# Patient Record
Sex: Female | Born: 1990 | Race: White | Hispanic: No | Marital: Single | Smoking: Former smoker
Health system: Southern US, Community
[De-identification: ages and names within clinical notes are randomized; demographics above are authoritative.]

## PROBLEM LIST (undated history)

## (undated) ENCOUNTER — Inpatient Hospital Stay (HOSPITAL_COMMUNITY): Payer: Self-pay

## (undated) DIAGNOSIS — F419 Anxiety disorder, unspecified: Secondary | ICD-10-CM

## (undated) DIAGNOSIS — Z789 Other specified health status: Secondary | ICD-10-CM

## (undated) DIAGNOSIS — G43909 Migraine, unspecified, not intractable, without status migrainosus: Secondary | ICD-10-CM

## (undated) DIAGNOSIS — F32A Depression, unspecified: Secondary | ICD-10-CM

## (undated) DIAGNOSIS — F329 Major depressive disorder, single episode, unspecified: Secondary | ICD-10-CM

## (undated) HISTORY — PX: WISDOM TOOTH EXTRACTION: SHX21

---

## 2012-09-25 ENCOUNTER — Encounter (HOSPITAL_COMMUNITY): Payer: Self-pay | Admitting: *Deleted

## 2012-09-25 DIAGNOSIS — R112 Nausea with vomiting, unspecified: Secondary | ICD-10-CM | POA: Insufficient documentation

## 2012-09-25 DIAGNOSIS — R109 Unspecified abdominal pain: Secondary | ICD-10-CM | POA: Insufficient documentation

## 2012-09-25 DIAGNOSIS — Z79899 Other long term (current) drug therapy: Secondary | ICD-10-CM | POA: Insufficient documentation

## 2012-09-25 DIAGNOSIS — Z3202 Encounter for pregnancy test, result negative: Secondary | ICD-10-CM | POA: Insufficient documentation

## 2012-09-25 LAB — COMPREHENSIVE METABOLIC PANEL
AST: 20 U/L (ref 0–37)
Albumin: 4 g/dL (ref 3.5–5.2)
Alkaline Phosphatase: 56 U/L (ref 39–117)
BUN: 9 mg/dL (ref 6–23)
Potassium: 3.9 mEq/L (ref 3.5–5.1)
Sodium: 137 mEq/L (ref 135–145)
Total Protein: 7.7 g/dL (ref 6.0–8.3)

## 2012-09-25 LAB — URINE MICROSCOPIC-ADD ON

## 2012-09-25 LAB — URINALYSIS, ROUTINE W REFLEX MICROSCOPIC
Ketones, ur: NEGATIVE mg/dL
Nitrite: NEGATIVE
Protein, ur: NEGATIVE mg/dL
Urobilinogen, UA: 1 mg/dL (ref 0.0–1.0)

## 2012-09-25 LAB — CBC WITH DIFFERENTIAL/PLATELET
Basophils Absolute: 0.1 10*3/uL (ref 0.0–0.1)
Basophils Relative: 0 % (ref 0–1)
Eosinophils Absolute: 0.6 10*3/uL (ref 0.0–0.7)
MCH: 29.8 pg (ref 26.0–34.0)
MCHC: 35 g/dL (ref 30.0–36.0)
Monocytes Relative: 7 % (ref 3–12)
Neutrophils Relative %: 72 % (ref 43–77)
Platelets: 366 10*3/uL (ref 150–400)
RDW: 12.5 % (ref 11.5–15.5)

## 2012-09-25 NOTE — ED Notes (Signed)
Rt sided flank pain for 2-3 days no bm for 6 days nauseated.  lmp  Last month

## 2012-09-26 ENCOUNTER — Emergency Department (HOSPITAL_COMMUNITY)
Admission: EM | Admit: 2012-09-26 | Discharge: 2012-09-26 | Disposition: A | Discharge status: Home / Self Care | Payer: Self-pay | Attending: Emergency Medicine | Admitting: Emergency Medicine

## 2012-09-26 ENCOUNTER — Encounter (HOSPITAL_COMMUNITY): Payer: Self-pay | Admitting: Radiology

## 2012-09-26 ENCOUNTER — Emergency Department (HOSPITAL_COMMUNITY): Payer: Self-pay

## 2012-09-26 DIAGNOSIS — R109 Unspecified abdominal pain: Secondary | ICD-10-CM

## 2012-09-26 MED ORDER — ONDANSETRON HCL 4 MG/2ML IJ SOLN
4.0000 mg | Freq: Once | INTRAMUSCULAR | Status: AC
  Administered 2012-09-26: 4 mg via INTRAVENOUS
  Filled 2012-09-26: qty 2

## 2012-09-26 MED ORDER — KETOROLAC TROMETHAMINE 30 MG/ML IJ SOLN
30.0000 mg | Freq: Once | INTRAMUSCULAR | Status: AC
  Administered 2012-09-26: 30 mg via INTRAVENOUS
  Filled 2012-09-26: qty 1

## 2012-09-26 MED ORDER — HYDROCODONE-ACETAMINOPHEN 5-325 MG PO TABS: 1.0000 | ORAL_TABLET | ORAL | Status: DC | PRN

## 2012-09-26 MED ORDER — ONDANSETRON 8 MG PO TBDP: 8.0000 mg | ORAL_TABLET | Freq: Three times a day (TID) | ORAL | Status: DC | PRN

## 2012-09-26 MED ORDER — MORPHINE SULFATE 4 MG/ML IJ SOLN
6.0000 mg | Freq: Once | INTRAMUSCULAR | Status: AC
  Administered 2012-09-26: 4 mg via INTRAVENOUS
  Filled 2012-09-26 (×2): qty 1

## 2012-09-26 NOTE — ED Notes (Signed)
Patient transported to CT 

## 2012-09-26 NOTE — ED Provider Notes (Signed)
History     CSN: [id number]  Arrival date & time 09/25/12  2101   First MD Initiated Contact with Patient 09/26/12 0023      Chief Complaint  Patient presents with  . Flank Pain     The history is provided by the patient.   patient reports right-sided abdominal pain and right-sided flank pain for 2-3 days.  She reports colicky pain.  No fevers or chills.  She's had nausea without vomiting.  She denies diarrhea.  No urinary complaints.  No vaginal complaints.  No melena or hematochezia.  Her symptoms are mild to moderate in severity.  Nothing worsens or improves her symptoms.  She denies fevers and chills  History reviewed. No pertinent past medical history.  History reviewed. No pertinent past surgical history.  No family history on file.  History  Substance Use Topics  . Smoking status: Never Smoker   . Smokeless tobacco: Not on file  . Alcohol Use: Yes    OB History   Grav Para Term Preterm Abortions TAB SAB Ect Mult Living                  Review of Systems  Genitourinary: Positive for flank pain.  All other systems reviewed and are negative.    Allergies  Pineapple  Home Medications   Current Outpatient Rx  Name  Route  Sig  Dispense  Refill  . atomoxetine (STRATTERA) 25 MG capsule   Oral   Take 25 mg by mouth daily.           BP 123/78  Pulse 73  Temp(Src) 98.4 F (36.9 C) (Oral)  Resp 18  SpO2 98%  LMP 09/11/2012  Physical Exam  Nursing note and vitals reviewed. Constitutional: She is oriented to person, place, and time. She appears well-developed and well-nourished. No distress.  HENT:  Head: Normocephalic and atraumatic.  Eyes: EOM are normal.  Neck: Normal range of motion.  Cardiovascular: Normal rate, regular rhythm and normal heart sounds.   Pulmonary/Chest: Effort normal and breath sounds normal.  Abdominal: Soft. She exhibits no distension.  Mild right-sided abdominal tenderness and right flank tenderness.  No guarding or rebound   Musculoskeletal: Normal range of motion.  Neurological: She is alert and oriented to person, place, and time.  Skin: Skin is warm and dry.  Psychiatric: She has a normal mood and affect. Judgment normal.    ED Course  Procedures (including critical care time)  Labs Reviewed  URINALYSIS, ROUTINE W REFLEX MICROSCOPIC - Abnormal; Notable for the following:    APPearance CLOUDY (*)    Hgb urine dipstick MODERATE (*)    Leukocytes, UA MODERATE (*)    All other components within normal limits  CBC WITH DIFFERENTIAL - Abnormal; Notable for the following:    WBC 15.7 (*)    Neutro Abs 11.3 (*)    All other components within normal limits  COMPREHENSIVE METABOLIC PANEL - Abnormal; Notable for the following:    Total Bilirubin 0.2 (*)    All other components within normal limits  URINE MICROSCOPIC-ADD ON - Abnormal; Notable for the following:    Squamous Epithelial / LPF FEW (*)    Bacteria, UA FEW (*)    All other components within normal limits  URINE CULTURE  PREGNANCY, URINE   Ct Abdomen Pelvis Wo Contrast  09/26/2012  *RADIOLOGY REPORT*  Clinical Data: 22 year old female with right flank, abdominal and pelvic pain.  CT ABDOMEN AND PELVIS WITHOUT CONTRAST  Technique:  Multidetector CT imaging of the abdomen and pelvis was performed following the standard protocol without intravenous contrast.  Comparison: None  Findings: The liver, gallbladder, spleen, pancreas, adrenal glands and kidneys are unremarkable.  There is no evidence of hydronephrosis or urinary calculi. Please note that parenchymal abnormalities may be missed as intravenous contrast was not administered. No enlarged lymph nodes, biliary dilation or abdominal aortic aneurysm identified.  A small amount of free pelvic fluid is likely physiologic. The bowel, appendix and bladder are unremarkable. The uterus and adnexal regions are within normal limits.  No acute or suspicious bony abnormalities are identified.  IMPRESSION: Trace  amount of free pelvic fluid - likely physiologic.  No other significant abnormalities identified.   Original Report Authenticated By: Harmon Pier, M.D.    I personally reviewed the imaging tests through PACS system I reviewed available ER/hospitalization records through the EMR   1. Abdominal pain       MDM  3:02 AM The patient feels much better at this time.  Abdominal exam is benign.  CT scan without acute pathology.  Urine and labs significant abnormality.  Discharge home and nausea medicine and pain medicine.  She understands return to the ER for new or worsening symptoms        Lyanne Co, MD 09/26/12 [phone]

## 2012-09-27 LAB — URINE CULTURE

## 2012-09-28 ENCOUNTER — Telehealth (HOSPITAL_COMMUNITY): Payer: Self-pay | Admitting: Emergency Medicine

## 2012-09-28 NOTE — ED Notes (Signed)
Patient has +Urine culture. Checking to see if appropriately treated. °

## 2012-09-28 NOTE — ED Notes (Signed)
+   Urine Chart sent to EDP office for review. 

## 2012-09-30 ENCOUNTER — Telehealth (HOSPITAL_COMMUNITY): Payer: Self-pay | Admitting: Emergency Medicine

## 2015-02-27 ENCOUNTER — Inpatient Hospital Stay (HOSPITAL_COMMUNITY)
Admission: AD | Admit: 2015-02-27 | Discharge: 2015-02-27 | Disposition: A | Discharge status: Home / Self Care | Payer: PRIVATE HEALTH INSURANCE | Source: Ambulatory Visit | Attending: Obstetrics and Gynecology | Admitting: Obstetrics and Gynecology

## 2015-02-27 ENCOUNTER — Encounter (HOSPITAL_COMMUNITY): Payer: Self-pay | Admitting: *Deleted

## 2015-02-27 ENCOUNTER — Inpatient Hospital Stay (HOSPITAL_COMMUNITY): Payer: PRIVATE HEALTH INSURANCE

## 2015-02-27 DIAGNOSIS — N939 Abnormal uterine and vaginal bleeding, unspecified: Secondary | ICD-10-CM | POA: Diagnosis present

## 2015-02-27 DIAGNOSIS — O209 Hemorrhage in early pregnancy, unspecified: Secondary | ICD-10-CM | POA: Diagnosis not present

## 2015-02-27 DIAGNOSIS — O4691 Antepartum hemorrhage, unspecified, first trimester: Secondary | ICD-10-CM

## 2015-02-27 DIAGNOSIS — Z3A01 Less than 8 weeks gestation of pregnancy: Secondary | ICD-10-CM | POA: Diagnosis not present

## 2015-02-27 DIAGNOSIS — Z87891 Personal history of nicotine dependence: Secondary | ICD-10-CM | POA: Insufficient documentation

## 2015-02-27 DIAGNOSIS — O3680X0 Pregnancy with inconclusive fetal viability, not applicable or unspecified: Secondary | ICD-10-CM

## 2015-02-27 LAB — HCG, QUANTITATIVE, PREGNANCY: hCG, Beta Chain, Quant, S: 293 m[IU]/mL — ABNORMAL HIGH (ref <5)

## 2015-02-27 LAB — URINE MICROSCOPIC-ADD ON

## 2015-02-27 LAB — GC/CHLAMYDIA PROBE AMP (~~LOC~~) NOT AT ARMC
Chlamydia: NEGATIVE
NEISSERIA GONORRHEA: NEGATIVE

## 2015-02-27 LAB — URINALYSIS, ROUTINE W REFLEX MICROSCOPIC
Bilirubin Urine: NEGATIVE
Glucose, UA: NEGATIVE mg/dL
Ketones, ur: NEGATIVE mg/dL
Leukocytes, UA: NEGATIVE
NITRITE: NEGATIVE
PH: 6 (ref 5.0–8.0)
Protein, ur: NEGATIVE mg/dL
SPECIFIC GRAVITY, URINE: 1.025 (ref 1.005–1.030)
Urobilinogen, UA: 0.2 mg/dL (ref 0.0–1.0)

## 2015-02-27 LAB — CBC
HEMATOCRIT: 41.2 % (ref 36.0–46.0)
Hemoglobin: 14 g/dL (ref 12.0–15.0)
MCH: 30.3 pg (ref 26.0–34.0)
MCHC: 34 g/dL (ref 30.0–36.0)
MCV: 89.2 fL (ref 78.0–100.0)
Platelets: 328 10*3/uL (ref 150–400)
RBC: 4.62 MIL/uL (ref 3.87–5.11)
RDW: 13.1 % (ref 11.5–15.5)
WBC: 11.2 10*3/uL — AB (ref 4.0–10.5)

## 2015-02-27 LAB — HIV ANTIBODY (ROUTINE TESTING W REFLEX): HIV Screen 4th Generation wRfx: NONREACTIVE

## 2015-02-27 LAB — WET PREP, GENITAL
CLUE CELLS WET PREP: NONE SEEN
Trich, Wet Prep: NONE SEEN
YEAST WET PREP: NONE SEEN

## 2015-02-27 LAB — ABO/RH: ABO/RH(D): O POS

## 2015-02-27 LAB — POCT PREGNANCY, URINE: Preg Test, Ur: POSITIVE — AB

## 2015-02-27 NOTE — Discharge Instructions (Signed)
°Ectopic Pregnancy °An ectopic pregnancy is when the fertilized egg attaches (implants) outside the uterus. Most ectopic pregnancies occur in the fallopian tube. Rarely do ectopic pregnancies occur on the ovary, intestine, pelvis, or cervix. In an ectopic pregnancy, the fertilized egg does not have the ability to develop into a normal, healthy baby.  °A ruptured ectopic pregnancy is one in which the fallopian tube gets torn or bursts and results in internal bleeding. Often there is intense abdominal pain, and sometimes, vaginal bleeding. Having an ectopic pregnancy can be life threatening. If left untreated, this dangerous condition can lead to a blood transfusion, abdominal surgery, or even death. °CAUSES  °Damage to the fallopian tubes is the suspected cause in most ectopic pregnancies.  °RISK FACTORS °Depending on your circumstances, the risk of having an ectopic pregnancy will vary. The level of risk can be divided into three categories. °High Risk °· You have gone through infertility treatment. °· You have had a previous ectopic pregnancy. °· You have had previous tubal surgery. °· You have had previous surgery to have the fallopian tubes tied (tubal ligation). °· You have tubal problems or diseases. °· You have been exposed to DES. DES is a medicine that was used until 1971 and had effects on babies whose mothers took the medicine. °· You become pregnant while using an intrauterine device (IUD) for birth control.  °Moderate Risk °· You have a history of infertility. °· You have a history of a sexually transmitted infection (STI). °· You have a history of pelvic inflammatory disease (PID). °· You have scarring from endometriosis. °· You have multiple sexual partners. °· You smoke.  °Low Risk °· You have had previous pelvic surgery. °· You use vaginal douching. °· You became sexually active before 23 years of age. °SIGNS AND SYMPTOMS  °An ectopic pregnancy should be suspected in anyone who has missed a period  and has abdominal pain or bleeding. °· You may experience normal pregnancy symptoms, such as: °¨ Nausea. °¨ Tiredness. °¨ Breast tenderness. °· Other symptoms may include: °¨ Pain with intercourse. °¨ Irregular vaginal bleeding or spotting. °¨ Cramping or pain on one side or in the lower abdomen. °¨ Fast heartbeat. °¨ Passing out while having a bowel movement. °· Symptoms of a ruptured ectopic pregnancy and internal bleeding may include: °¨ Sudden, severe pain in the abdomen and pelvis. °¨ Dizziness or fainting. °¨ Pain in the shoulder area. °DIAGNOSIS  °Tests that may be performed include: °· A pregnancy test. °· An ultrasound test. °· Testing the specific level of pregnancy hormone in the bloodstream. °· Taking a sample of uterus tissue (dilation and curettage, D&C). °· Surgery to perform a visual exam of the inside of the abdomen using a thin, lighted tube with a tiny camera on the end (laparoscope). °TREATMENT  °An injection of a medicine called methotrexate may be given. This medicine causes the pregnancy tissue to be absorbed. It is given if: °· The diagnosis is made early. °· The fallopian tube has not ruptured. °· You are considered to be a good candidate for the medicine. °Usually, pregnancy hormone blood levels are checked after methotrexate treatment. This is to be sure the medicine is effective. It may take 4-6 weeks for the pregnancy to be absorbed (though most pregnancies will be absorbed by 3 weeks). °Surgical treatment may be needed. A laparoscope may be used to remove the pregnancy tissue. If severe internal bleeding occurs, a cut (incision) may be made in the lower abdomen (laparotomy), and the ectopic   pregnancy is removed. This stops the bleeding. Part of the fallopian tube, or the whole tube, may be removed as well (salpingectomy). After surgery, pregnancy hormone tests may be done to be sure there is no pregnancy tissue left. You may receive a Rho (D) immune globulin shot if you are Rh negative  and the father is Rh positive, or if you do not know the Rh type of the father. This is to prevent problems with any future pregnancy. °SEEK IMMEDIATE MEDICAL CARE IF:  °You have any symptoms of an ectopic pregnancy. This is a medical emergency. °MAKE SURE YOU: °· Understand these instructions. °· Will watch your condition. °· Will get help right away if you are not doing well or get worse. °Document Released: 08/25/2004 Document Revised: 12/02/2013 Document Reviewed: 02/14/2013 °ExitCare® Patient Information ©2015 [employer]. This information is not intended to replace advice given to you by your health care provider. Make sure you discuss any questions you have with your health care provider. ° ° °

## 2015-02-27 NOTE — MAU Provider Note (Signed)
History     CSN: [id number]  Arrival date and time: 02/27/15 0030   First Provider Initiated Contact with Patient 02/27/15 [phone]      Chief Complaint  Patient presents with  . Vaginal Bleeding   Vaginal Bleeding The patient's primary symptoms include vaginal bleeding. This is a new problem. The current episode started yesterday. The problem occurs constantly. The problem has been gradually worsening. The pain is mild (4/10 ). The problem affects both sides. She is pregnant. Associated symptoms include abdominal pain and nausea. Pertinent negatives include no constipation, diarrhea, dysuria, fever, frequency, urgency or vomiting. The vaginal discharge was bloody. The vaginal bleeding is typical of menses. She has been passing clots. She has not been passing tissue. Nothing aggravates the symptoms. She has tried nothing for the symptoms.    History reviewed. No pertinent past medical history.  Past Surgical History  Procedure Laterality Date  . Wisdom tooth extraction      No family history on file.  History  Substance Use Topics  . Smoking status: Former Smoker    Quit date: 02/13/2015  . Smokeless tobacco: Not on file  . Alcohol Use: No    Allergies:  Allergies  Allergen Reactions  . Kiwi Extract Itching    Prescriptions prior to admission  Medication Sig Dispense Refill Last Dose  . atomoxetine (STRATTERA) 25 MG capsule Take 25 mg by mouth daily.   09/25/2012 at Unknown  . HYDROcodone-acetaminophen (NORCO/VICODIN) 5-325 MG per tablet Take 1 tablet by mouth every 4 (four) hours as needed for pain. 15 tablet 0   . ondansetron (ZOFRAN ODT) 8 MG disintegrating tablet Take 1 tablet (8 mg total) by mouth every 8 (eight) hours as needed for nausea. 10 tablet 0     Review of Systems  Constitutional: Negative for fever.  Gastrointestinal: Positive for nausea and abdominal pain. Negative for vomiting, diarrhea and constipation.  Genitourinary: Positive for vaginal bleeding.  Negative for dysuria, urgency and frequency.   Physical Exam   Blood pressure 120/79, pulse 67, temperature 98.6 F (37 C), temperature source Oral, height  (1.651 m), last menstrual period 01/09/2015.  Physical Exam  Nursing note and vitals reviewed. Constitutional: She is oriented to person, place, and time. She appears well-developed and well-nourished. No distress.  HENT:  Head: Normocephalic.  Cardiovascular: Normal rate.   Respiratory: Effort normal.  GI: Soft. There is no tenderness. There is no rebound.  Genitourinary:  External: no lesion Vagina: small amount of bright red blood seen  Cervix: pink, smooth, no CMT Uterus: NSSC Adnexa: NT   Neurological: She is alert and oriented to person, place, and time.  Skin: Skin is warm and dry.  Psychiatric: She has a normal mood and affect.   Results for orders placed or performed during the hospital encounter of 02/27/15 (from the past 24 hour(s))  Urinalysis, Routine w reflex microscopic (not at [facility])     Status: Abnormal   Collection Time: 02/27/15 12:40 AM  Result Value Ref Range   Color, Urine YELLOW YELLOW   APPearance CLEAR CLEAR   Specific Gravity, Urine 1.025 1.005 - 1.030   pH 6.0 5.0 - 8.0   Glucose, UA NEGATIVE NEGATIVE mg/dL   Hgb urine dipstick LARGE (A) NEGATIVE   Bilirubin Urine NEGATIVE NEGATIVE   Ketones, ur NEGATIVE NEGATIVE mg/dL   Protein, ur NEGATIVE NEGATIVE mg/dL   Urobilinogen, UA 0.2 0.0 - 1.0 mg/dL   Nitrite NEGATIVE NEGATIVE   Leukocytes, UA NEGATIVE NEGATIVE  Urine microscopic-add  on     Status: Abnormal   Collection Time: 02/27/15 12:40 AM  Result Value Ref Range   Squamous Epithelial / LPF RARE RARE   WBC, UA 3-6 <3 WBC/hpf   RBC / HPF 21-50 <3 RBC/hpf   Bacteria, UA FEW (A) RARE   Urine-Other MUCOUS PRESENT   Pregnancy, urine POC     Status: Abnormal   Collection Time: 02/27/15 12:45 AM  Result Value Ref Range   Preg Test, Ur POSITIVE (A) NEGATIVE  CBC     Status: Abnormal    Collection Time: 02/27/15  1:03 AM  Result Value Ref Range   WBC 11.2 (H) 4.0 - 10.5 K/uL   RBC 4.62 3.87 - 5.11 MIL/uL   Hemoglobin 14.0 12.0 - 15.0 g/dL   HCT 16.1 09.6 - 04.5 %   MCV 89.2 78.0 - 100.0 fL   MCH 30.3 26.0 - 34.0 pg   MCHC 34.0 30.0 - 36.0 g/dL   RDW 40.9 81.1 - 91.4 %   Platelets 328 150 - 400 K/uL  hCG, quantitative, pregnancy     Status: Abnormal   Collection Time: 02/27/15  1:03 AM  Result Value Ref Range   hCG, Beta Chain, Quant, S 293 (H) <5 mIU/mL  Wet prep, genital     Status: Abnormal   Collection Time: 02/27/15  1:15 AM  Result Value Ref Range   Yeast Wet Prep HPF POC NONE SEEN NONE SEEN   Trich, Wet Prep NONE SEEN NONE SEEN   Clue Cells Wet Prep HPF POC NONE SEEN NONE SEEN   WBC, Wet Prep HPF POC FEW (A) NONE SEEN   US Ob Comp Less 14 Wks  02/27/2015   CLINICAL DATA:  Cramping and bleeding  EXAM: OBSTETRIC <14 WK [place] AND TRANSVAGINAL OB US  TECHNIQUE: Both transabdominal and transvaginal ultrasound examinations were performed for complete evaluation of the gestation as well as the maternal uterus, adnexal regions, and pelvic cul-de-sac. Transvaginal technique was performed to assess early pregnancy.  COMPARISON:  None.  FINDINGS: Intrauterine gestational sac: None  Yolk sac:  No  Embryo:  No  Cardiac Activity: No  Maternal uterus/adnexae: Normal. Trace free pelvic fluid in the cul-de-sac.  IMPRESSION: No visible gestational sac. Differential considerations include a missed spontaneous abortion, intrauterine gestation which is too small to visualize, ectopic gestation. Serial HCG and follow-up sonography recommended.   Electronically Signed   By: Ellery Plunk M.D.   On: 02/27/2015 02:05   US Ob Transvaginal  02/27/2015   CLINICAL DATA:  Cramping and bleeding  EXAM: OBSTETRIC <14 WK [place] AND TRANSVAGINAL OB US  TECHNIQUE: Both transabdominal and transvaginal ultrasound examinations were performed for complete evaluation of the gestation as well as the maternal  uterus, adnexal regions, and pelvic cul-de-sac. Transvaginal technique was performed to assess early pregnancy.  COMPARISON:  None.  FINDINGS: Intrauterine gestational sac: None  Yolk sac:  No  Embryo:  No  Cardiac Activity: No  Maternal uterus/adnexae: Normal. Trace free pelvic fluid in the cul-de-sac.  IMPRESSION: No visible gestational sac. Differential considerations include a missed spontaneous abortion, intrauterine gestation which is too small to visualize, ectopic gestation. Serial HCG and follow-up sonography recommended.   Electronically Signed   By: Ellery Plunk M.D.   On: 02/27/2015 02:05    MAU Course  Procedures  MDM 0213: D/W Dr. Vincente Poli, will do repeat HCG here in MAU.   Assessment and Plan   1. Pregnancy of unknown anatomic location   2. Vaginal bleeding  in pregnancy, first trimester    DC home Comfort measures reviewed  1stTrimester precautions  Bleeding precautions Ectopic precautionss RX: none  Return to MAU as needed FU with OB as planned  Follow-up Information    Follow up with [facility] [place] MATERNITY ADMISSIONS.   Why:  Sunday 03/01/15 anytime after 8:00AM    Contact information:   [address] [id number] mc [place] [address] [phone]        Tawnya Crook 02/27/2015, 1:10 AM

## 2015-02-27 NOTE — MAU Note (Signed)
Pt. Started bleeding yesterday and then it went away, then this evening she started cramping and the bleeding started again. Pt. Had a positive pregnancy test at home

## 2015-03-01 ENCOUNTER — Inpatient Hospital Stay (HOSPITAL_COMMUNITY)
Admission: AD | Admit: 2015-03-01 | Discharge: 2015-03-01 | Disposition: A | Discharge status: Home / Self Care | Payer: PRIVATE HEALTH INSURANCE | Source: Ambulatory Visit | Attending: Obstetrics and Gynecology | Admitting: Obstetrics and Gynecology

## 2015-03-01 DIAGNOSIS — Z3A01 Less than 8 weeks gestation of pregnancy: Secondary | ICD-10-CM | POA: Insufficient documentation

## 2015-03-01 DIAGNOSIS — O009 Ectopic pregnancy, unspecified: Secondary | ICD-10-CM | POA: Insufficient documentation

## 2015-03-01 DIAGNOSIS — O039 Complete or unspecified spontaneous abortion without complication: Secondary | ICD-10-CM | POA: Insufficient documentation

## 2015-03-01 LAB — HCG, QUANTITATIVE, PREGNANCY: HCG, BETA CHAIN, QUANT, S: 92 m[IU]/mL — AB (ref <5)

## 2015-03-01 NOTE — MAU Provider Note (Signed)
History   Chief Complaint:  Labs Only   Sarah Haley is  24 y.o. G1P0 Patient's last menstrual period was 01/09/2015.Marland Kitchen Patient is here for follow up of quantitative HCG and ongoing surveillance of pregnancy status.   She is [redacted]w[redacted]d weeks gestation  by LMP.    Since her last visit, the patient is with new complaint.   The patient reports bleeding has decreased to spotting.  Denies abdominal pain.  General ROS:  negative  Her previous Quantitative HCG values are:  02/27/2015: 293  Physical Exam   Blood pressure 110/66, pulse 63, temperature 98.2 F (36.8 C), resp. rate 18, last menstrual period 01/09/2015.  Focused Gynecological Exam: examination not indicated General: No apparent distress. Alert and oriented 4. Heart: Regular rate Lungs: Regular rhythm. Normal effort. No respiratory distress.  Labs: Results for orders placed or performed during the hospital encounter of 03/01/15 (from the past 24 hour(s))  hCG, quantitative, pregnancy   Collection Time: 03/01/15  8:05 AM  Result Value Ref Range   hCG, Beta Chain, Quant, S 92 (H) <5 mIU/mL    Ultrasound Studies:   US Ob Comp Less 14 Wks  02/27/2015   CLINICAL DATA:  Cramping and bleeding  EXAM: OBSTETRIC <14 WK [place] AND TRANSVAGINAL OB US  TECHNIQUE: Both transabdominal and transvaginal ultrasound examinations were performed for complete evaluation of the gestation as well as the maternal uterus, adnexal regions, and pelvic cul-de-sac. Transvaginal technique was performed to assess early pregnancy.  COMPARISON:  None.  FINDINGS: Intrauterine gestational sac: None  Yolk sac:  No  Embryo:  No  Cardiac Activity: No  Maternal uterus/adnexae: Normal. Trace free pelvic fluid in the cul-de-sac.  IMPRESSION: No visible gestational sac. Differential considerations include a missed spontaneous abortion, intrauterine gestation which is too small to visualize, ectopic gestation. Serial HCG and follow-up sonography recommended.   Electronically  Signed   By: Ellery Plunk M.D.   On: 02/27/2015 02:05   US Ob Transvaginal  02/27/2015   CLINICAL DATA:  Cramping and bleeding  EXAM: OBSTETRIC <14 WK [place] AND TRANSVAGINAL OB US  TECHNIQUE: Both transabdominal and transvaginal ultrasound examinations were performed for complete evaluation of the gestation as well as the maternal uterus, adnexal regions, and pelvic cul-de-sac. Transvaginal technique was performed to assess early pregnancy.  COMPARISON:  None.  FINDINGS: Intrauterine gestational sac: None  Yolk sac:  No  Embryo:  No  Cardiac Activity: No  Maternal uterus/adnexae: Normal. Trace free pelvic fluid in the cul-de-sac.  IMPRESSION: No visible gestational sac. Differential considerations include a missed spontaneous abortion, intrauterine gestation which is too small to visualize, ectopic gestation. Serial HCG and follow-up sonography recommended.   Electronically Signed   By: Ellery Plunk M.D.   On: 02/27/2015 02:05    Assessment: SAB versus spontaneously resolving ectopic pregnancy.  Plan: Discharge home in stable condition per consult with Dr. Henderson Cloud. Support given. Follow-up Quant in office in 1 week. Needs to call the office tomorrow morning to schedule lab visit. Follow-up Information    Follow up with [facility] Cristie Hem, MD On 03/06/2015.   Specialty:  Obstetrics and Gynecology   Why:  Labs   Contact information:   [address] [place] [address] [phone]       Follow up with [facility] [place] MATERNITY ADMISSIONS.   Why:  As needed in emergencies   Contact information:   [address] [id number] mc [place] [address] [phone]  Medication List    TAKE these medications        atomoxetine 25 MG capsule  Commonly known as:  STRATTERA  Take 25 mg by mouth daily.     HYDROcodone-acetaminophen 5-325 MG per tablet  Commonly known as:  NORCO/VICODIN  Take 1 tablet by mouth every 4 (four)  hours as needed for pain.     ondansetron 8 MG disintegrating tablet  Commonly known as:  ZOFRAN ODT  Take 1 tablet (8 mg total) by mouth every 8 (eight) hours as needed for nausea.       Osborn Pullin 03/01/2015, 9:09 AM

## 2015-03-01 NOTE — Discharge Instructions (Signed)
Your pregnancy hormone level dropped. This means that you're having a miscarriage (or less likely that you have a spontaneously resolving ectopic pregnancy.)   02/27/2015: 293 03/01/2015: 92  Miscarriage A miscarriage is the sudden loss of an unborn baby (fetus) before the 20th week of pregnancy. Most miscarriages happen in the first 3 months of pregnancy. Sometimes, it happens before a woman even knows she is pregnant. A miscarriage is also called a "spontaneous miscarriage" or "early pregnancy loss." Having a miscarriage can be an emotional experience. Talk with your caregiver about any questions you may have about miscarrying, the grieving process, and your future pregnancy plans. CAUSES   Problems with the fetal chromosomes that make it impossible for the baby to develop normally. Problems with the baby's genes or chromosomes are most often the result of errors that occur, by chance, as the embryo divides and grows. The problems are not inherited from the parents.  Infection of the cervix or uterus.   Hormone problems.   Problems with the cervix, such as having an incompetent cervix. This is when the tissue in the cervix is not strong enough to hold the pregnancy.   Problems with the uterus, such as an abnormally shaped uterus, uterine fibroids, or congenital abnormalities.   Certain medical conditions.   Smoking, drinking alcohol, or taking illegal drugs.   Trauma.  Often, the cause of a miscarriage is unknown.  SYMPTOMS   Vaginal bleeding or spotting, with or without cramps or pain.  Pain or cramping in the abdomen or lower back.  Passing fluid, tissue, or blood clots from the vagina. DIAGNOSIS  Your caregiver will perform a physical exam. You may also have an ultrasound to confirm the miscarriage. Blood or urine tests may also be ordered. TREATMENT   Sometimes, treatment is not necessary if you naturally pass all the fetal tissue that was in the uterus. If some of  the fetus or placenta remains in the body (incomplete miscarriage), tissue left behind may become infected and must be removed. Usually, a dilation and curettage (D and C) procedure is performed. During a D and C procedure, the cervix is widened (dilated) and any remaining fetal or placental tissue is gently removed from the uterus.  Antibiotic medicines are prescribed if there is an infection. Other medicines may be given to reduce the size of the uterus (contract) if there is a lot of bleeding.  If you have Rh negative blood and your baby was Rh positive, you will need a Rh immunoglobulin shot. This shot will protect any future baby from having Rh blood problems in future pregnancies. HOME CARE INSTRUCTIONS   Your caregiver may order bed rest or may allow you to continue light activity. Resume activity as directed by your caregiver.  Have someone help with home and family responsibilities during this time.   Keep track of the number of sanitary pads you use each day and how soaked (saturated) they are. Write down this information.   Do not use tampons. Do not douche or have sexual intercourse until approved by your caregiver.   Only take over-the-counter or prescription medicines for pain or discomfort as directed by your caregiver.   Do not take aspirin. Aspirin can cause bleeding.   Keep all follow-up appointments with your caregiver.   If you or your partner have problems with grieving, talk to your caregiver or seek counseling to help cope with the pregnancy loss. Allow enough time to grieve before trying to get pregnant again.  SEEK IMMEDIATE MEDICAL CARE IF:   You have severe cramps or pain in your back or abdomen.  You have a fever.  You pass large blood clots (walnut-sized or larger) ortissue from your vagina. Save any tissue for your caregiver to inspect.   Your bleeding increases.   You have a thick, bad-smelling vaginal discharge.  You become lightheaded,  weak, or you faint.   You have chills.  MAKE SURE YOU:  Understand these instructions.  Will watch your condition.  Will get help right away if you are not doing well or get worse. Document Released: 01/11/2001 Document Revised: 11/12/2012 Document Reviewed: 09/06/2011 [facility] Patient Information 2015 [place], [place]. This information is not intended to replace advice given to you by your health care provider. Make sure you discuss any questions you have with your health care provider.

## 2015-03-01 NOTE — MAU Note (Signed)
Pt presents to MAU for repeat labs BHCG. Denies any pain, reports scant amount of bleeding

## 2015-08-02 NOTE — L&D Delivery Note (Signed)
Delivery Note At 2:12 PM a viable female was delivered via Vaginal, Spontaneous Delivery (presentation: vertex ).  APGAR and weight pending. Placenta status: routine.  Mild uterine atony resolved with methergine, Cytotec, and doubling of pitocin   Anesthesia:   Episiotomy: None Lacerations: None Est. Blood Loss (mL): 400  Mom to postpartum.  Baby to Couplet care / Skin to Skin. It's a girl, "Sarah Haley"!!   Ranae PilaElise Jennifer Leger MD 04/02/2016, 2:44 PM

## 2015-08-28 LAB — OB RESULTS CONSOLE ANTIBODY SCREEN: Antibody Screen: NEGATIVE

## 2015-08-28 LAB — OB RESULTS CONSOLE ABO/RH: RH Type: POSITIVE

## 2015-08-28 LAB — OB RESULTS CONSOLE HIV ANTIBODY (ROUTINE TESTING): HIV: NONREACTIVE

## 2015-08-28 LAB — OB RESULTS CONSOLE RUBELLA ANTIBODY, IGM: Rubella: IMMUNE

## 2015-08-28 LAB — OB RESULTS CONSOLE RPR: RPR: NONREACTIVE

## 2015-08-28 LAB — OB RESULTS CONSOLE HEPATITIS B SURFACE ANTIGEN: Hepatitis B Surface Ag: NEGATIVE

## 2015-09-10 LAB — OB RESULTS CONSOLE GC/CHLAMYDIA
CHLAMYDIA, DNA PROBE: NEGATIVE
GC PROBE AMP, GENITAL: NEGATIVE

## 2016-01-02 ENCOUNTER — Encounter (HOSPITAL_COMMUNITY): Payer: Self-pay | Admitting: *Deleted

## 2016-03-04 LAB — OB RESULTS CONSOLE GBS: GBS: POSITIVE

## 2016-03-06 ENCOUNTER — Inpatient Hospital Stay (HOSPITAL_COMMUNITY)
Admission: AD | Admit: 2016-03-06 | Discharge: 2016-03-06 | Disposition: A | Discharge status: Home / Self Care | Payer: Medicaid Other | Source: Ambulatory Visit | Attending: Obstetrics & Gynecology | Admitting: Obstetrics & Gynecology

## 2016-03-06 ENCOUNTER — Encounter (HOSPITAL_COMMUNITY): Payer: Self-pay | Admitting: *Deleted

## 2016-03-06 DIAGNOSIS — O4703 False labor before 37 completed weeks of gestation, third trimester: Secondary | ICD-10-CM

## 2016-03-06 DIAGNOSIS — O99283 Endocrine, nutritional and metabolic diseases complicating pregnancy, third trimester: Secondary | ICD-10-CM | POA: Diagnosis not present

## 2016-03-06 DIAGNOSIS — O26893 Other specified pregnancy related conditions, third trimester: Secondary | ICD-10-CM | POA: Insufficient documentation

## 2016-03-06 DIAGNOSIS — Z87891 Personal history of nicotine dependence: Secondary | ICD-10-CM | POA: Insufficient documentation

## 2016-03-06 DIAGNOSIS — R102 Pelvic and perineal pain: Secondary | ICD-10-CM | POA: Diagnosis present

## 2016-03-06 DIAGNOSIS — R55 Syncope and collapse: Secondary | ICD-10-CM

## 2016-03-06 DIAGNOSIS — E860 Dehydration: Secondary | ICD-10-CM

## 2016-03-06 DIAGNOSIS — Z3A35 35 weeks gestation of pregnancy: Secondary | ICD-10-CM | POA: Diagnosis not present

## 2016-03-06 DIAGNOSIS — R42 Dizziness and giddiness: Secondary | ICD-10-CM | POA: Diagnosis present

## 2016-03-06 LAB — CBC
HCT: 32.9 % — ABNORMAL LOW (ref 36.0–46.0)
Hemoglobin: 10.9 g/dL — ABNORMAL LOW (ref 12.0–15.0)
MCH: 28.8 pg (ref 26.0–34.0)
MCHC: 33.1 g/dL (ref 30.0–36.0)
MCV: 87 fL (ref 78.0–100.0)
PLATELETS: 238 10*3/uL (ref 150–400)
RBC: 3.78 MIL/uL — ABNORMAL LOW (ref 3.87–5.11)
RDW: 13.1 % (ref 11.5–15.5)
WBC: 12.7 10*3/uL — ABNORMAL HIGH (ref 4.0–10.5)

## 2016-03-06 LAB — URINALYSIS, ROUTINE W REFLEX MICROSCOPIC
Bilirubin Urine: NEGATIVE
Glucose, UA: NEGATIVE mg/dL
Hgb urine dipstick: NEGATIVE
Ketones, ur: NEGATIVE mg/dL
Nitrite: NEGATIVE
PROTEIN: NEGATIVE mg/dL
Specific Gravity, Urine: 1.025 (ref 1.005–1.030)
pH: 6 (ref 5.0–8.0)

## 2016-03-06 LAB — URINE MICROSCOPIC-ADD ON

## 2016-03-06 LAB — BASIC METABOLIC PANEL
Anion gap: 9 (ref 5–15)
BUN: 6 mg/dL (ref 6–20)
CALCIUM: 8.9 mg/dL (ref 8.9–10.3)
CO2: 20 mmol/L — ABNORMAL LOW (ref 22–32)
Chloride: 106 mmol/L (ref 101–111)
Creatinine, Ser: 0.57 mg/dL (ref 0.44–1.00)
Glucose, Bld: 86 mg/dL (ref 65–99)
Potassium: 3.7 mmol/L (ref 3.5–5.1)
Sodium: 135 mmol/L (ref 135–145)

## 2016-03-06 MED ORDER — LACTATED RINGERS IV BOLUS (SEPSIS)
1000.0000 mL | Freq: Once | INTRAVENOUS | Status: AC
  Administered 2016-03-06: 1000 mL via INTRAVENOUS

## 2016-03-06 NOTE — MAU Note (Signed)
Patient presents to mau with c/o dizziniess and vaginal pressure. Patient states "I was walking to get clothes and 'fainted'. Endorses that she remembers the event. Does note having a "hot flash" before the dizziness occurred.

## 2016-03-06 NOTE — MAU Provider Note (Signed)
History     CSN: [id number]  Arrival date and time: 03/06/16 1454   First Provider Initiated Contact with Patient 03/06/16 1541      Chief Complaint  Patient presents with  . Dizziness  . vaginal pressure   Sarah Haley is a 25 y.o. G2P0010 at [id number] presenting with dizzy episodes for 2 days and pelvic pressure today. She states that yesterday she was walking at home when she felt dizzy and fell forward with no loss of consciousness and no injury. Today at work she again felt weak and dizzy prompting her to come to MAU. She had similar weak and dizzy spells about 2 years ago at which time she had a workup including a normal EKG. She's been having abdominal cramping for a couple of weeks but experienced an increase in pelvic pressure and intermittent mid LBP today. No vaginal bleeding. Good fetal movement.    Dizziness  This is a recurrent problem. The current episode started in the past 7 days. The problem occurs intermittently. The problem has been unchanged. Associated symptoms include weakness. Pertinent negatives include no chest pain, diaphoresis, headaches, numbness, vertigo, visual change or vomiting. Nothing aggravates the symptoms. She has tried nothing for the symptoms.     History reviewed. No pertinent past medical history.  Past Surgical History:  Procedure Laterality Date  . WISDOM TOOTH EXTRACTION      History reviewed. No pertinent family history.  Social History  Substance Use Topics  . Smoking status: Former Smoker    Quit date: 02/13/2015  . Smokeless tobacco: Former NeurosurgeonUser  . Alcohol use No    Allergies:  Allergies  Allergen Reactions  . Kiwi Extract Itching  . Latex Swelling    Prescriptions Prior to Admission  Medication Sig Dispense Refill Last Dose  . calcium carbonate (TUMS - DOSED IN MG ELEMENTAL CALCIUM) 500 MG chewable tablet Chew 1 tablet by mouth daily.   Past Week at Unknown time    Review of Systems  Constitutional: Negative for  diaphoresis.  Eyes: Negative for blurred vision.  Cardiovascular: Negative for chest pain.  Gastrointestinal: Negative for vomiting.  Genitourinary: Negative for dysuria, flank pain, frequency, hematuria and urgency.  Neurological: Positive for dizziness and weakness. Negative for vertigo, numbness and headaches.   Physical Exam   Blood pressure 127/71, pulse 67, temperature 97.7 F (36.5 C), temperature source Oral, resp. rate 18, height 5\' 5"  (1.651 m), weight 76.7 kg (169 lb 0.6 oz), last menstrual period 07/01/2015, SpO2 100 %, unknown if currently breastfeeding.  Physical Exam  Constitutional: She is oriented to person, place, and time. She appears well-developed and well-nourished. No distress.  HENT:  Head: Normocephalic.  Eyes: Pupils are equal, round, and reactive to light.  Neck: Normal range of motion.  Cardiovascular: Normal rate, regular rhythm and normal heart sounds.   Respiratory: Effort normal and breath sounds normal.  GI: Soft. There is no tenderness.  Occ palpable mild UC  Genitourinary: Vagina normal.  Genitourinary Comments: VE: poatrior, soft, 1.5/50/-3 cephalic  Musculoskeletal: Normal range of motion.  Neurological: She is alert and oriented to person, place, and time.  Skin: Skin is warm and dry.  Psychiatric: She has a normal mood and affect. Her behavior is normal.    EFM: Baseline 130, moderate variability, accelerations 20 bpm, no decelerations Toco: UI/UCs irregular q 2-4, mild  MAU Course  Procedures Results for orders placed or performed during the hospital encounter of 03/06/16 (from the past 24 hour(s))  Urinalysis, Routine w  reflex microscopic (not at [facility])     Status: Abnormal   Collection Time: 03/06/16  3:10 PM  Result Value Ref Range   Color, Urine YELLOW YELLOW   APPearance HAZY (A) CLEAR   Specific Gravity, Urine 1.025 1.005 - 1.030   pH 6.0 5.0 - 8.0   Glucose, UA NEGATIVE NEGATIVE mg/dL   Hgb urine dipstick NEGATIVE NEGATIVE    Bilirubin Urine NEGATIVE NEGATIVE   Ketones, ur NEGATIVE NEGATIVE mg/dL   Protein, ur NEGATIVE NEGATIVE mg/dL   Nitrite NEGATIVE NEGATIVE   Leukocytes, UA LARGE (A) NEGATIVE  Urine microscopic-add on     Status: Abnormal   Collection Time: 03/06/16  3:10 PM  Result Value Ref Range   Squamous Epithelial / LPF 6-30 (A) NONE SEEN   WBC, UA 6-30 0 - 5 WBC/hpf   RBC / HPF 0-5 0 - 5 RBC/hpf   Bacteria, UA MANY (A) NONE SEEN  CBC     Status: Abnormal   Collection Time: 03/06/16  4:12 PM  Result Value Ref Range   WBC 12.7 (H) 4.0 - 10.5 K/uL   RBC 3.78 (L) 3.87 - 5.11 MIL/uL   Hemoglobin 10.9 (L) 12.0 - 15.0 g/dL   HCT 86.5 (L) 78.4 - 69.6 %   MCV 87.0 78.0 - 100.0 fL   MCH 28.8 26.0 - 34.0 pg   MCHC 33.1 30.0 - 36.0 g/dL   RDW 29.5 28.4 - 13.2 %   Platelets 238 150 - 400 K/uL  Basic metabolic panel     Status: Abnormal   Collection Time: 03/06/16  4:12 PM  Result Value Ref Range   Sodium 135 135 - 145 mmol/L   Potassium 3.7 3.5 - 5.1 mmol/L   Chloride 106 101 - 111 mmol/L   CO2 20 (L) 22 - 32 mmol/L   Glucose, Bld 86 65 - 99 mg/dL   BUN 6 6 - 20 mg/dL   Creatinine, Ser 4.40 0.44 - 1.00 mg/dL   Calcium 8.9 8.9 - 10.2 mg/dL   GFR calc non Af Amer >60 >60 mL/min   GFR calc Af Amer >60 >60 mL/min   Anion gap 9 5 - 15  Urine C&S sent  C/W Dr. Langston Masker Will proceed with IVF bolus and recheck cx if UCs persist IVLR 1000cc 1940: IV infused, feels better. Ucs mild irregular. VE: posterior 1/thick/-3  Assessment and Plan  G2P0010 at [redacted]w[redacted]d 1. Near syncope   2. Preterm contractions, third trimester   3. Mild dehydration      Medication List    TAKE these medications   calcium carbonate 500 MG chewable tablet Commonly known as:  TUMS - dosed in mg elemental calcium Chew 1 tablet by mouth daily.       Follow-up Information    MORRIS, MEGAN, DO .   Specialty:  Obstetrics and Gynecology Why:  Keep your scheduled prenatal appointment Contact information: [address] [place] [address] [phone]          POE,DEIRDRE 03/06/2016, 4:15 PM

## 2016-03-06 NOTE — Discharge Instructions (Signed)
Braxton Hicks Contractions °Contractions of the uterus can occur throughout pregnancy. Contractions are not always a sign that you are in labor.  °WHAT ARE BRAXTON HICKS CONTRACTIONS?  °Contractions that occur before labor are called Braxton Hicks contractions, or false labor. Toward the end of pregnancy (32-34 weeks), these contractions can develop more often and may become more forceful. This is not true labor because these contractions do not result in opening (dilatation) and thinning of the cervix. They are sometimes difficult to tell apart from true labor because these contractions can be forceful and people have different pain tolerances. You should not feel embarrassed if you go to the hospital with false labor. Sometimes, the only way to tell if you are in true labor is for your health care provider to look for changes in the cervix. °If there are no prenatal problems or other health problems associated with the pregnancy, it is completely safe to be sent home with false labor and await the onset of true labor. °HOW CAN YOU TELL THE DIFFERENCE BETWEEN TRUE AND FALSE LABOR? °False Labor °· The contractions of false labor are usually shorter and not as hard as those of true labor.   °· The contractions are usually irregular.   °· The contractions are often felt in the front of the lower abdomen and in the groin.   °· The contractions may go away when you walk around or change positions while lying down.   °· The contractions get weaker and are shorter lasting as time goes on.   °· The contractions do not usually become progressively stronger, regular, and closer together as with true labor.   °True Labor °· Contractions in true labor last 30-70 seconds, become very regular, usually become more intense, and increase in frequency.   °· The contractions do not go away with walking.   °· The discomfort is usually felt in the top of the uterus and spreads to the lower abdomen and low back.   °· True labor can be  determined by your health care provider with an exam. This will show that the cervix is dilating and getting thinner.   °WHAT TO REMEMBER °· Keep up with your usual exercises and follow other instructions given by your health care provider.   °· Take medicines as directed by your health care provider.   °· Keep your regular prenatal appointments.   °· Eat and drink lightly if you think you are going into labor.   °· If Braxton Hicks contractions are making you uncomfortable:   °¨ Change your position from lying down or resting to walking, or from walking to resting.   °¨ Sit and rest in a tub of warm water.   °¨ Drink 2-3 glasses of water. Dehydration may cause these contractions.   °¨ Do slow and deep breathing several times an hour.   °WHEN SHOULD I SEEK IMMEDIATE MEDICAL CARE? °Seek immediate medical care if: °· Your contractions become stronger, more regular, and closer together.   °· You have fluid leaking or gushing from your vagina.   °· You have a fever.   °· You pass blood-tinged mucus.   °· You have vaginal bleeding.   °· You have continuous abdominal pain.   °· You have low back pain that you never had before.   °· You feel your baby's head pushing down and causing pelvic pressure.   °· Your baby is not moving as much as it used to.   °  °This information is not intended to replace advice given to you by your health care provider. Make sure you discuss any questions you have with your health care   provider.   Document Released: 07/18/2005 Document Revised: 07/23/2013 Document Reviewed: 04/29/2013 Elsevier Interactive Patient Education 2016 [employer]  Near-Syncope Near-syncope (commonly known as near fainting) is sudden weakness, dizziness, or feeling like you might pass out. During an episode of near-syncope, you may also develop pale skin, have tunnel vision, or feel sick to your stomach (nauseous). Near-syncope may occur when getting up after sitting or while standing for a long time. It is  caused by a sudden decrease in blood flow to the brain. This decrease can result from various causes or triggers, most of which are not serious. However, because near-syncope can sometimes be a sign of something serious, a medical evaluation is required. The specific cause is often not determined. HOME CARE INSTRUCTIONS  Monitor your condition for any changes. The following actions may help to alleviate any discomfort you are experiencing:  Have someone stay with you until you feel stable.  Lie down right away and prop your feet up if you start feeling like you might faint. Breathe deeply and steadily. Wait until all the symptoms have passed. Most of these episodes last only a few minutes. You may feel tired for several hours.   Drink enough fluids to keep your urine clear or pale yellow.   If you are taking blood pressure or heart medicine, get up slowly when seated or lying down. Take several minutes to sit and then stand. This can reduce dizziness.  Follow up with your health care provider as directed. SEEK IMMEDIATE MEDICAL CARE IF:   You have a severe headache.   You have unusual pain in the chest, abdomen, or back.   You are bleeding from the mouth or rectum, or you have black or tarry stool.   You have an irregular or very fast heartbeat.   You have repeated fainting or have seizure-like jerking during an episode.   You faint when sitting or lying down.   You have confusion.   You have difficulty walking.   You have severe weakness.   You have vision problems.  MAKE SURE YOU:   Understand these instructions.  Will watch your condition.  Will get help right away if you are not doing well or get worse.   This information is not intended to replace advice given to you by your health care provider. Make sure you discuss any questions you have with your health care provider.   Document Released: 07/18/2005 Document Revised: 07/23/2013 Document Reviewed:  12/21/2012 Elsevier Interactive Patient Education [employer] Elsevier Inc.

## 2016-03-07 LAB — CULTURE, OB URINE

## 2016-03-14 ENCOUNTER — Inpatient Hospital Stay (HOSPITAL_COMMUNITY)
Admission: AD | Admit: 2016-03-14 | Discharge: 2016-03-14 | Disposition: A | Discharge status: Home / Self Care | Payer: Medicaid Other | Source: Ambulatory Visit | Attending: Obstetrics and Gynecology | Admitting: Obstetrics and Gynecology

## 2016-03-14 ENCOUNTER — Encounter (HOSPITAL_COMMUNITY): Payer: Self-pay | Admitting: *Deleted

## 2016-03-14 DIAGNOSIS — Z91040 Latex allergy status: Secondary | ICD-10-CM | POA: Diagnosis not present

## 2016-03-14 DIAGNOSIS — Z3A36 36 weeks gestation of pregnancy: Secondary | ICD-10-CM | POA: Insufficient documentation

## 2016-03-14 DIAGNOSIS — Z3689 Encounter for other specified antenatal screening: Secondary | ICD-10-CM

## 2016-03-14 DIAGNOSIS — O4693 Antepartum hemorrhage, unspecified, third trimester: Secondary | ICD-10-CM | POA: Diagnosis present

## 2016-03-14 DIAGNOSIS — Z87891 Personal history of nicotine dependence: Secondary | ICD-10-CM | POA: Insufficient documentation

## 2016-03-14 NOTE — MAU Provider Note (Signed)
  History     CSN: [id number]  Arrival date and time: 03/14/16 [id number]   First Provider Initiated Contact with Patient 03/14/16 1932      No chief complaint on file.  G2P0010 @36 .5 weeks c/o spotting that started shortly after SVE in office today. She reports applying a panty liner and after taking a nap it was saturated with bright red blood. She also reports passing some dime sized clots. Good FM. No LOF. Irregular ctx. Pregnancy uncomplicated.   OB History    Gravida Para Term Preterm AB Living   2       1     SAB TAB Ectopic Multiple Live Births                  No past medical history on file.  Past Surgical History:  Procedure Laterality Date  . WISDOM TOOTH EXTRACTION      No family history on file.  Social History  Substance Use Topics  . Smoking status: Former Smoker    Quit date: 02/13/2015  . Smokeless tobacco: Former NeurosurgeonUser  . Alcohol use No    Allergies:  Allergies  Allergen Reactions  . Kiwi Extract Itching  . Latex Swelling    Prescriptions Prior to Admission  Medication Sig Dispense Refill Last Dose  . calcium carbonate (TUMS - DOSED IN MG ELEMENTAL CALCIUM) 500 MG chewable tablet Chew 1 tablet by mouth daily.   Past Week at Unknown time    Review of Systems  Constitutional: Negative.   Genitourinary: Negative.    Physical Exam   Blood pressure 125/69, pulse 86, temperature 98 F (36.7 C), last menstrual period 07/01/2015, unknown if currently breastfeeding.  Physical Exam  Constitutional: She is oriented to person, place, and time. She appears well-developed and well-nourished.  HENT:  Head: Normocephalic.  Neck: Normal range of motion. Neck supple.  Cardiovascular: Normal rate.   Respiratory: Effort normal.  GI: Soft.  gravid  Genitourinary:  Genitourinary Comments: Speculum: no active bleeding from os, ectropian present, scant brown discharge SVE: 2/50/-2  Musculoskeletal: Normal range of motion.  Neurological: She is alert and  oriented to person, place, and time.  Skin: Skin is warm and dry.  Psychiatric: She has a normal mood and affect.   EFM: 140 bpm, mod variability, + accels, no decels Toco: irregular  MAU Course  Procedures  MDM No signs of abruption. Bleeding likely r/t cervical exam earlier. Discussed findings with Dr. Elon SpannerLeger. Stable for discharge home.  Assessment and Plan   1. Vaginal bleeding in pregnancy, third trimester   2. NST (non-stress test) reactive    Discharge home Labor precautions Bleeding precautions Follow up in 1 week in office as scheduled Return for worsening sx  Donette LarryMelanie Ayson Cherubini, CNM 03/14/2016, 7:33 PM

## 2016-03-14 NOTE — Discharge Instructions (Signed)
Braxton Hicks Contractions °Contractions of the uterus can occur throughout pregnancy. Contractions are not always a sign that you are in labor.  °WHAT ARE BRAXTON HICKS CONTRACTIONS?  °Contractions that occur before labor are called Braxton Hicks contractions, or false labor. Toward the end of pregnancy (32-34 weeks), these contractions can develop more often and may become more forceful. This is not true labor because these contractions do not result in opening (dilatation) and thinning of the cervix. They are sometimes difficult to tell apart from true labor because these contractions can be forceful and people have different pain tolerances. You should not feel embarrassed if you go to the hospital with false labor. Sometimes, the only way to tell if you are in true labor is for your health care provider to look for changes in the cervix. °If there are no prenatal problems or other health problems associated with the pregnancy, it is completely safe to be sent home with false labor and await the onset of true labor. °HOW CAN YOU TELL THE DIFFERENCE BETWEEN TRUE AND FALSE LABOR? °False Labor °· The contractions of false labor are usually shorter and not as hard as those of true labor.   °· The contractions are usually irregular.   °· The contractions are often felt in the front of the lower abdomen and in the groin.   °· The contractions may go away when you walk around or change positions while lying down.   °· The contractions get weaker and are shorter lasting as time goes on.   °· The contractions do not usually become progressively stronger, regular, and closer together as with true labor.   °True Labor °· Contractions in true labor last 30-70 seconds, become very regular, usually become more intense, and increase in frequency.   °· The contractions do not go away with walking.   °· The discomfort is usually felt in the top of the uterus and spreads to the lower abdomen and low back.   °· True labor can be  determined by your health care provider with an exam. This will show that the cervix is dilating and getting thinner.   °WHAT TO REMEMBER °· Keep up with your usual exercises and follow other instructions given by your health care provider.   °· Take medicines as directed by your health care provider.   °· Keep your regular prenatal appointments.   °· Eat and drink lightly if you think you are going into labor.   °· If Braxton Hicks contractions are making you uncomfortable:   °¨ Change your position from lying down or resting to walking, or from walking to resting.   °¨ Sit and rest in a tub of warm water.   °¨ Drink 2-3 glasses of water. Dehydration may cause these contractions.   °¨ Do slow and deep breathing several times an hour.   °WHEN SHOULD I SEEK IMMEDIATE MEDICAL CARE? °Seek immediate medical care if: °· Your contractions become stronger, more regular, and closer together.   °· You have fluid leaking or gushing from your vagina.   °· You have a fever.   °· You pass blood-tinged mucus.   °· You have vaginal bleeding.   °· You have continuous abdominal pain.   °· You have low back pain that you never had before.   °· You feel your baby's head pushing down and causing pelvic pressure.   °· Your baby is not moving as much as it used to.   °  °This information is not intended to replace advice given to you by your health care provider. Make sure you discuss any questions you have with your health care   provider. °  °Document Released: 07/18/2005 Document Revised: 07/23/2013 Document Reviewed: 04/29/2013 °Elsevier Interactive Patient Education ©2016 Elsevier Inc. ° °

## 2016-03-14 NOTE — MAU Note (Signed)
Pt. Went to the doctor today and they checked her cervix.  She has been having bright red bleeding and clots that are about the size of a dime.  She is also having contractions.

## 2016-04-01 ENCOUNTER — Inpatient Hospital Stay (HOSPITAL_COMMUNITY)
Admission: AD | Admit: 2016-04-01 | Discharge: 2016-04-04 | DRG: 774 | Disposition: A | Discharge status: Home / Self Care | Payer: Medicaid Other | Source: Ambulatory Visit | Attending: Obstetrics and Gynecology | Admitting: Obstetrics and Gynecology

## 2016-04-01 ENCOUNTER — Encounter (HOSPITAL_COMMUNITY): Payer: Self-pay

## 2016-04-01 DIAGNOSIS — O99824 Streptococcus B carrier state complicating childbirth: Secondary | ICD-10-CM | POA: Diagnosis present

## 2016-04-01 DIAGNOSIS — Z87891 Personal history of nicotine dependence: Secondary | ICD-10-CM | POA: Diagnosis not present

## 2016-04-01 DIAGNOSIS — Z91040 Latex allergy status: Secondary | ICD-10-CM

## 2016-04-01 DIAGNOSIS — Z3A39 39 weeks gestation of pregnancy: Secondary | ICD-10-CM

## 2016-04-01 DIAGNOSIS — Z3490 Encounter for supervision of normal pregnancy, unspecified, unspecified trimester: Secondary | ICD-10-CM

## 2016-04-01 DIAGNOSIS — O99344 Other mental disorders complicating childbirth: Secondary | ICD-10-CM | POA: Diagnosis present

## 2016-04-01 DIAGNOSIS — F418 Other specified anxiety disorders: Secondary | ICD-10-CM | POA: Diagnosis present

## 2016-04-01 DIAGNOSIS — O368130 Decreased fetal movements, third trimester, not applicable or unspecified: Secondary | ICD-10-CM | POA: Diagnosis present

## 2016-04-01 HISTORY — DX: Anxiety disorder, unspecified: F41.9

## 2016-04-01 HISTORY — DX: Depression, unspecified: F32.A

## 2016-04-01 HISTORY — DX: Major depressive disorder, single episode, unspecified: F32.9

## 2016-04-01 HISTORY — DX: Migraine, unspecified, not intractable, without status migrainosus: G43.909

## 2016-04-01 HISTORY — DX: Other specified health status: Z78.9

## 2016-04-01 LAB — TYPE AND SCREEN
ABO/RH(D): O POS
Antibody Screen: NEGATIVE

## 2016-04-01 LAB — CBC
HEMATOCRIT: 35.9 % — AB (ref 36.0–46.0)
HEMOGLOBIN: 12 g/dL (ref 12.0–15.0)
MCH: 27.5 pg (ref 26.0–34.0)
MCHC: 33.4 g/dL (ref 30.0–36.0)
MCV: 82.3 fL (ref 78.0–100.0)
Platelets: 294 10*3/uL (ref 150–400)
RBC: 4.36 MIL/uL (ref 3.87–5.11)
RDW: 13.4 % (ref 11.5–15.5)
WBC: 13.4 10*3/uL — AB (ref 4.0–10.5)

## 2016-04-01 MED ORDER — FLEET ENEMA 7-19 GM/118ML RE ENEM: 1.0000 | ENEMA | RECTAL | Status: DC | PRN

## 2016-04-01 MED ORDER — SOD CITRATE-CITRIC ACID 500-334 MG/5ML PO SOLN
30.0000 mL | ORAL | Status: DC | PRN
  Administered 2016-04-02 (×2): 30 mL via ORAL
  Filled 2016-04-01 (×2): qty 15

## 2016-04-01 MED ORDER — LACTATED RINGERS IV SOLN: 500.0000 mL | INTRAVENOUS | Status: DC | PRN

## 2016-04-01 MED ORDER — HYDROMORPHONE HCL 1 MG/ML IJ SOLN
1.0000 mg | INTRAMUSCULAR | Status: DC | PRN
  Administered 2016-04-02: 1 mg via INTRAVENOUS
  Filled 2016-04-01: qty 1

## 2016-04-01 MED ORDER — OXYTOCIN BOLUS FROM INFUSION
500.0000 mL | Freq: Once | INTRAVENOUS | Status: AC
  Administered 2016-04-02: 500 mL via INTRAVENOUS

## 2016-04-01 MED ORDER — LIDOCAINE HCL (PF) 1 % IJ SOLN
30.0000 mL | INTRAMUSCULAR | Status: DC | PRN
  Filled 2016-04-01: qty 30

## 2016-04-01 MED ORDER — ACETAMINOPHEN 325 MG PO TABS: 650.0000 mg | ORAL_TABLET | ORAL | Status: DC | PRN

## 2016-04-01 MED ORDER — FAMOTIDINE 20 MG PO TABS
10.0000 mg | ORAL_TABLET | ORAL | Status: AC | PRN
  Administered 2016-04-01: 10 mg via ORAL
  Filled 2016-04-01: qty 1

## 2016-04-01 MED ORDER — ONDANSETRON HCL 4 MG/2ML IJ SOLN
4.0000 mg | Freq: Four times a day (QID) | INTRAMUSCULAR | Status: DC | PRN
  Administered 2016-04-02: 4 mg via INTRAVENOUS
  Filled 2016-04-01: qty 2

## 2016-04-01 MED ORDER — OXYCODONE-ACETAMINOPHEN 5-325 MG PO TABS: 1.0000 | ORAL_TABLET | ORAL | Status: DC | PRN

## 2016-04-01 MED ORDER — PENICILLIN G POTASSIUM 5000000 UNITS IJ SOLR
2.5000 10*6.[IU] | INTRAVENOUS | Status: DC
  Administered 2016-04-02 (×3): 2.5 10*6.[IU] via INTRAVENOUS
  Filled 2016-04-01 (×9): qty 2.5

## 2016-04-01 MED ORDER — LACTATED RINGERS IV SOLN
INTRAVENOUS | Status: DC
  Administered 2016-04-01: 22:00:00 via INTRAVENOUS

## 2016-04-01 MED ORDER — PENICILLIN G POTASSIUM 5000000 UNITS IJ SOLR
5.0000 10*6.[IU] | Freq: Once | INTRAVENOUS | Status: AC
  Administered 2016-04-01: 5 10*6.[IU] via INTRAVENOUS
  Filled 2016-04-01: qty 5

## 2016-04-01 MED ORDER — OXYCODONE-ACETAMINOPHEN 5-325 MG PO TABS: 2.0000 | ORAL_TABLET | ORAL | Status: DC | PRN

## 2016-04-01 MED ORDER — OXYTOCIN 40 UNITS IN LACTATED RINGERS INFUSION - SIMPLE MED
2.5000 [IU]/h | INTRAVENOUS | Status: DC
  Administered 2016-04-02: 2.5 [IU]/h via INTRAVENOUS

## 2016-04-01 NOTE — MAU Note (Signed)
Pt c/o contractions every 5-6 minutes since 3pm. Pt also c/o vaginal pressure. Pt was dilated 3-4cm on Monday at MD office. Pt states baby hasn't been moving as much as normal today. Pt denies bleeding and leaking of fluid.

## 2016-04-01 NOTE — H&P (Signed)
Jeanell SparrowCaleigh Thomas is a 25 y.o. female presenting for labor and decreased Fm.    OB History    Gravida Para Term Preterm AB Living   2       1 0   SAB TAB Ectopic Multiple Live Births                MAB x 1  Past Medical History:  Diagnosis Date  . Anxiety   . Depression    h/o self harm  . Medical history non-contributory   . Migraines    Past Surgical History:  Procedure Laterality Date  . WISDOM TOOTH EXTRACTION     Family History: family history includes Cancer in her maternal grandmother; Diabetes in her maternal grandfather. Social History:  reports that she quit smoking about 13 months ago. She has quit using smokeless tobacco. She reports that she does not drink alcohol or use drugs.     Maternal Diabetes: No Genetic Screening: Normal Maternal Ultrasounds/Referrals: Normal Fetal Ultrasounds or other Referrals:  None Maternal Substance Abuse:  No Significant Maternal Medications:  None Significant Maternal Lab Results:  None Other Comments:  None  ROS History Dilation: 4.5 Effacement (%): 90 Station: -2 Exam by:: [email or web address],rnc Blood pressure 119/82, pulse 91, temperature 98.1 F (36.7 C), temperature source Oral, resp. rate 18, height 5\' 5"  (1.651 m), weight 78.5 kg (173 lb), last menstrual period 07/01/2015, SpO2 98 %, unknown if currently breastfeeding. Exam Physical Exam  Gen: NAD Pulm: NWOB Abd: soft, nondistended, gravid Ext: no edema b/l  Prenatal labs: ABO, Rh:  O+ Antibody:  Ab neg Rubella:  Immune RPR:   NR HBsAg:   NR HIV:   NR GBS: Positive (08/04 0000)   Assessment/Plan: 25 yo G2P0010 @ 39.2 wga presents in latent labor and with dec Fm. NST is reactive, however, tracing at times cat 2 for var decels and occ late decels. Will admit to L&D for early labor/cefm.    # Latent labor:  - expectant management at this time  # MWB: - h/o depression/anxiety with self harm, no meds, stable - h/o migraines, no current meds - LATEX ALLERGY. Will  avoid.   # FWB:  - overall reassuring with reactive NST but times with var/late decels, ctm closely - vertex  # GBS pos - PCN per protocol   Ranae PilaElise Jennifer Jenevie Casstevens 04/01/2016, 9:08 PM

## 2016-04-02 ENCOUNTER — Inpatient Hospital Stay (HOSPITAL_COMMUNITY): Payer: Medicaid Other | Admitting: Anesthesiology

## 2016-04-02 LAB — RPR: RPR: NONREACTIVE

## 2016-04-02 MED ORDER — CALCIUM CARBONATE ANTACID 500 MG PO CHEW
1.0000 | CHEWABLE_TABLET | ORAL | Status: DC | PRN
Start: 2016-04-02 — End: 2016-04-02
  Administered 2016-04-02: 200 mg via ORAL
  Filled 2016-04-02: qty 1

## 2016-04-02 MED ORDER — OXYCODONE HCL 5 MG PO TABS: 5.0000 mg | ORAL_TABLET | ORAL | Status: DC | PRN

## 2016-04-02 MED ORDER — IBUPROFEN 600 MG PO TABS
600.0000 mg | ORAL_TABLET | Freq: Four times a day (QID) | ORAL | Status: DC
  Administered 2016-04-02 – 2016-04-04 (×6): 600 mg via ORAL
  Filled 2016-04-02 (×5): qty 1

## 2016-04-02 MED ORDER — ACETAMINOPHEN 325 MG PO TABS: 650.0000 mg | ORAL_TABLET | ORAL | Status: DC | PRN

## 2016-04-02 MED ORDER — COCONUT OIL OIL: 1.0000 "application " | TOPICAL_OIL | Status: DC | PRN

## 2016-04-02 MED ORDER — PHENYLEPHRINE 40 MCG/ML (10ML) SYRINGE FOR IV PUSH (FOR BLOOD PRESSURE SUPPORT)
80.0000 ug | PREFILLED_SYRINGE | INTRAVENOUS | Status: DC | PRN
  Filled 2016-04-02: qty 5

## 2016-04-02 MED ORDER — TERBUTALINE SULFATE 1 MG/ML IJ SOLN
0.2500 mg | Freq: Once | INTRAMUSCULAR | Status: DC | PRN
  Filled 2016-04-02: qty 1

## 2016-04-02 MED ORDER — PHENYLEPHRINE 40 MCG/ML (10ML) SYRINGE FOR IV PUSH (FOR BLOOD PRESSURE SUPPORT)
PREFILLED_SYRINGE | INTRAVENOUS | Status: AC
  Filled 2016-04-02: qty 20

## 2016-04-02 MED ORDER — ZOLPIDEM TARTRATE 5 MG PO TABS: 5.0000 mg | ORAL_TABLET | Freq: Every evening | ORAL | Status: DC | PRN

## 2016-04-02 MED ORDER — TETANUS-DIPHTH-ACELL PERTUSSIS 5-2.5-18.5 LF-MCG/0.5 IM SUSP: 0.5000 mL | Freq: Once | INTRAMUSCULAR | Status: DC

## 2016-04-02 MED ORDER — LACTATED RINGERS IV SOLN: 500.0000 mL | Freq: Once | INTRAVENOUS | Status: DC

## 2016-04-02 MED ORDER — LIDOCAINE HCL (PF) 1 % IJ SOLN
INTRAMUSCULAR | Status: DC | PRN
  Administered 2016-04-02 (×2): 6 mL

## 2016-04-02 MED ORDER — FENTANYL 2.5 MCG/ML BUPIVACAINE 1/10 % EPIDURAL INFUSION (WH - ANES)
14.0000 mL/h | INTRAMUSCULAR | Status: DC | PRN
  Administered 2016-04-02: 14 mL/h via EPIDURAL

## 2016-04-02 MED ORDER — DIPHENHYDRAMINE HCL 50 MG/ML IJ SOLN
12.5000 mg | INTRAMUSCULAR | Status: DC | PRN
Start: 2016-04-02 — End: 2016-04-02

## 2016-04-02 MED ORDER — MISOPROSTOL 200 MCG PO TABS
1000.0000 ug | ORAL_TABLET | Freq: Once | ORAL | Status: AC
Start: 2016-04-02 — End: 2016-04-02
  Administered 2016-04-02: 1000 ug via RECTAL

## 2016-04-02 MED ORDER — OXYTOCIN 40 UNITS IN LACTATED RINGERS INFUSION - SIMPLE MED
1.0000 m[IU]/min | INTRAVENOUS | Status: DC
  Administered 2016-04-02: 2 m[IU]/min via INTRAVENOUS
  Filled 2016-04-02: qty 1000

## 2016-04-02 MED ORDER — FENTANYL 2.5 MCG/ML BUPIVACAINE 1/10 % EPIDURAL INFUSION (WH - ANES)
INTRAMUSCULAR | Status: AC
  Filled 2016-04-02: qty 125

## 2016-04-02 MED ORDER — DIPHENHYDRAMINE HCL 25 MG PO CAPS: 25.0000 mg | ORAL_CAPSULE | Freq: Four times a day (QID) | ORAL | Status: DC | PRN

## 2016-04-02 MED ORDER — EPHEDRINE 5 MG/ML INJ
10.0000 mg | INTRAVENOUS | Status: DC | PRN
  Filled 2016-04-02: qty 4

## 2016-04-02 MED ORDER — METHYLERGONOVINE MALEATE 0.2 MG/ML IJ SOLN
0.2000 mg | Freq: Once | INTRAMUSCULAR | Status: AC
  Administered 2016-04-02: 0.2 mg via INTRAMUSCULAR

## 2016-04-02 MED ORDER — BENZOCAINE-MENTHOL 20-0.5 % EX AERO: 1.0000 "application " | INHALATION_SPRAY | CUTANEOUS | Status: DC | PRN

## 2016-04-02 MED ORDER — DIBUCAINE 1 % RE OINT: 1.0000 "application " | TOPICAL_OINTMENT | RECTAL | Status: DC | PRN

## 2016-04-02 MED ORDER — ONDANSETRON HCL 4 MG/2ML IJ SOLN: 4.0000 mg | INTRAMUSCULAR | Status: DC | PRN

## 2016-04-02 MED ORDER — SENNOSIDES-DOCUSATE SODIUM 8.6-50 MG PO TABS
2.0000 | ORAL_TABLET | ORAL | Status: DC
  Administered 2016-04-03: 2 via ORAL
  Filled 2016-04-02 (×3): qty 2

## 2016-04-02 MED ORDER — MISOPROSTOL 200 MCG PO TABS
ORAL_TABLET | ORAL | Status: AC
  Filled 2016-04-02: qty 5

## 2016-04-02 MED ORDER — OXYCODONE HCL 5 MG PO TABS: 10.0000 mg | ORAL_TABLET | ORAL | Status: DC | PRN

## 2016-04-02 MED ORDER — WITCH HAZEL-GLYCERIN EX PADS: 1.0000 "application " | MEDICATED_PAD | CUTANEOUS | Status: DC | PRN

## 2016-04-02 MED ORDER — LACTATED RINGERS IV SOLN
500.0000 mL | Freq: Once | INTRAVENOUS | Status: AC
  Administered 2016-04-02: 500 mL via INTRAVENOUS

## 2016-04-02 MED ORDER — ONDANSETRON HCL 4 MG PO TABS: 4.0000 mg | ORAL_TABLET | ORAL | Status: DC | PRN

## 2016-04-02 MED ORDER — PRENATAL MULTIVITAMIN CH
1.0000 | ORAL_TABLET | Freq: Every day | ORAL | Status: DC
  Administered 2016-04-03: 1 via ORAL
  Filled 2016-04-02: qty 1

## 2016-04-02 MED ORDER — SIMETHICONE 80 MG PO CHEW: 80.0000 mg | CHEWABLE_TABLET | ORAL | Status: DC | PRN

## 2016-04-02 NOTE — Lactation Note (Signed)
This note was copied from a baby's chart. Lactation Consultation Note  P1, 7 hours old.  Baby has been coming off and on breast per RN. Mother has short shaft nipples w/ small areolas.  Able to hand express drops bilaterally. Assisted w/ helping baby sustain latch and depth by compressing breast for maximum depth and massage to keep baby actively sucking. Had mother prepump w/ manual pump. Baby latched on both breasts in football and cross cradle positions. At times baby slipped down to nipple and nipple became pinched.  Taught mother how unlatch baby and relatch, waiting for wide open mouth. Reviewed basics and Baby & Me booklet.  Mom encouraged to feed baby 8-12 times/24 hours and with feeding cues.  Mom made aware of O/P services, breastfeeding support groups, community resources, and our phone # for post-discharge questions.    Patient Name: Sarah Jeanell SparrowCaleigh Thomas EAVWU'JToday's Date: 04/02/2016 Reason for consult: Initial assessment   Maternal Data Has patient been taught Hand Expression?: Yes Does the patient have breastfeeding experience prior to this delivery?: No  Feeding Feeding Type: Breast Fed  LATCH Score/Interventions Latch: Repeated attempts needed to sustain latch, nipple held in mouth throughout feeding, stimulation needed to elicit sucking reflex. Intervention(s): Adjust position;Assist with latch;Breast massage;Breast compression  Audible Swallowing: A few with stimulation Intervention(s): Skin to skin;Hand expression  Type of Nipple: Everted at rest and after stimulation  Comfort (Breast/Nipple): Soft / non-tender (short shaft)     Hold (Positioning): Assistance needed to correctly position infant at breast and maintain latch. Intervention(s): Support Pillows;Position options  LATCH Score: 7  Lactation Tools Discussed/Used     Consult Status Consult Status: Follow-up Date: 04/03/16 Follow-up type: In-patient    Dahlia ByesBerkelhammer, Ruth [facility] LP[facility]oschen 04/02/2016, 9:48  PM

## 2016-04-02 NOTE — Anesthesia Preprocedure Evaluation (Signed)
Anesthesia Evaluation  Patient identified by MRN, date of birth, ID band Patient awake    Reviewed: Allergy & Precautions, NPO status , Patient's Chart, lab work & pertinent test results  Airway Mallampati: II  TM Distance: >3 FB Neck ROM: Full    Dental no notable dental hx.    Pulmonary neg pulmonary ROS, former smoker,    Pulmonary exam normal breath sounds clear to auscultation       Cardiovascular negative cardio ROS Normal cardiovascular exam Rhythm:Regular Rate:Normal     Neuro/Psych  Headaches, PSYCHIATRIC DISORDERS Anxiety Depression    GI/Hepatic negative GI ROS, Neg liver ROS,   Endo/Other  negative endocrine ROS  Renal/GU negative Renal ROS  negative genitourinary   Musculoskeletal negative musculoskeletal ROS (+)   Abdominal   Peds negative pediatric ROS (+)  Hematology negative hematology ROS (+)   Anesthesia Other Findings   Reproductive/Obstetrics negative OB ROS                             Anesthesia Physical Anesthesia Plan  ASA: II  Anesthesia Plan: Epidural   Post-op Pain Management:    Induction: Intravenous  Airway Management Planned: Natural Airway  Additional Equipment:   Intra-op Plan:   Post-operative Plan:   Informed Consent: I have reviewed the patients History and Physical, chart, labs and discussed the procedure including the risks, benefits and alternatives for the proposed anesthesia with the patient or authorized representative who has indicated his/her understanding and acceptance.   Dental advisory given  Plan Discussed with: CRNA  Anesthesia Plan Comments: (Informed consent obtained prior to proceeding including risk of failure, 1% risk of PDPH, risk of minor discomfort and bruising.  Discussed rare but serious complications including epidural abscess, permanent nerve injury, epidural hematoma.  Discussed alternatives to epidural  analgesia and patient desires to proceed.  Timeout performed pre-procedure verifying patient name, procedure, and platelet count.  Patient tolerated procedure well.)        Anesthesia Quick Evaluation

## 2016-04-02 NOTE — Progress Notes (Signed)
Patient ID: Sarah Haley, female   DOB: August 26, 1990, 25 y.o.   MRN: [id number] S: Contractions have spaced out. Was sleeping for the last hour.   O:  Vitals  Vitals:   04/02/16 0733 04/02/16 0734  BP: 126/79 126/79  Pulse: 68 68  Resp:  20  Temp:  97.8 F (36.6 C)     Gen: NAD SVE: 5/80/-2 FHT: cat 1 Toco: q 2-4 min  A/P: 25 yo G2P0010 @ 39.2 wga presented in latent labor and with dec Fm. NST was reactive, however, tracing at times cat 2 for var decels and occ late decels. Was admitted to L&D for early labor/cefm now with labor being augmented.    # Latent labor:  - pitocin - s/p AROM for clear fluid - SVE 5/80/-2 - IUPC at next exam if no change  # MWB: - h/o depression/anxiety with self harm, no meds, stable - h/o migraines, no current meds - LATEX ALLERGY. Will avoid.   # FWB:  - overall reassuring with reactive NST but times with var/late decels, ctm closely - vertex - EFW 6#12  # GBS pos - PCN per protocol   Belva AgeeElise Ardith Lewman MD

## 2016-04-02 NOTE — Anesthesia Procedure Notes (Signed)
Epidural Patient location during procedure: OB  Staffing Anesthesiologist: Johnnye Sandford  Preanesthetic Checklist Completed: patient identified, site marked, surgical consent, pre-op evaluation, timeout performed, IV checked, risks and benefits discussed and monitors and equipment checked  Epidural Patient position: sitting Prep: DuraPrep Patient monitoring: blood pressure and heart rate Approach: midline Location: L3-L4 Injection technique: LOR saline  Needle:  Needle type: Tuohy  Needle gauge: 17 G Needle length: 9 cm Needle insertion depth: 4 cm Catheter type: closed end flexible Catheter size: 19 Gauge Catheter at skin depth: 12 cm Test dose: negative and Other  Assessment Events: blood not aspirated, injection not painful, no injection resistance, negative IV test and no paresthesia  Additional Notes Reason for block:procedure for pain     

## 2016-04-03 LAB — CBC
HCT: 30.7 % — ABNORMAL LOW (ref 36.0–46.0)
Hemoglobin: 10.3 g/dL — ABNORMAL LOW (ref 12.0–15.0)
MCH: 27.8 pg (ref 26.0–34.0)
MCHC: 33.6 g/dL (ref 30.0–36.0)
MCV: 82.7 fL (ref 78.0–100.0)
PLATELETS: 265 10*3/uL (ref 150–400)
RBC: 3.71 MIL/uL — ABNORMAL LOW (ref 3.87–5.11)
RDW: 13.7 % (ref 11.5–15.5)
WBC: 16.8 10*3/uL — ABNORMAL HIGH (ref 4.0–10.5)

## 2016-04-03 NOTE — Clinical Social Work Maternal (Signed)
CLINICAL SOCIAL WORK MATERNAL/CHILD NOTE  Patient Details  Name: Sarah Haley MRN: [id number] Date of Birth: 07/29/1991  Date:  04/03/2016  Clinical Social Worker Initiating Note:  Andriea Hasegawa, MSW, LCSW-A            Date/ Time Initiated:  04/03/16/0941                   Child's Name:  Sarah Haley   Legal Guardian:  Mother   Need for Interpreter:  None   Date of Referral:  04/02/16     Reason for Referral:  Other (Comment) (MOB hx of Anxiety/Depression )   Referral Source:  Physician   Address:  [address] [place], [place] [address]  Phone number:  [phone]   Household Members: Self, Significant Other, Other (Comment) (Baby girl Sarah Haley )   Natural Supports (not living in the home): Immediate Family, Friends, Spouse/significant other   Professional Supports:    Employment:Part-time   Type of Work: [employer]    Education:  9 to 11 years   Financial Resources:    Other Resources:     Cultural/Religious Considerations Which May Impact Care: Per face sheet none   Strengths:     Risk Factors/Current Problems: Other (Comment) (MOB hx of anxiety / depression )   Cognitive State: Goal Oriented , Alert , Insightful    Mood/Affect: Calm , Relaxed , Interested , Bright    CSW Assessment:CSW met with MOB at bedside. This writer explained her role and reasoning for visit being due to MOB hx of anxiety/ depression. MOB describes not currently experiencing any anxiety/depression is not sure if she wants to get back on meds; however, if she feels she needs to she will consult her physician first. This writer discussed PPD and SIDS. MOB verbalized understanding of the two. MOB states she is going to make an appointment at WIC and with her foodstamps case worker to add baby. MOB states she is prepared for the home and has a car seat and crib/bassinett. No other needs addressed or requested at this time. Case closed to this  CSW.    CSW Plan/Description: No Further Intervention Required/No Barriers to Discharge    Tauno Falotico, MSW, LCSW-A Clinical Social Worker  [facility]  Office: [phone]    CLINICAL SOCIAL WORK MATERNAL/CHILD NOTE  Patient Details  Name: Sarah Haley MRN: [id number] Date of Birth: [birth year]  Date:  04/03/2016  Clinical Social Worker Initiating Note:  Ferdinand Lango Loyce Klasen, MSW, LCSW-A Date/ Time Initiated:  04/03/16/0941     Child's Name:  Sarah Haley   Legal Guardian:  Mother   Need for Interpreter:  None   Date of Referral:  04/02/16     Reason for Referral:  Other (Comment) (MOB hx of Anxiety/Depression )   Referral Source:  Physician   Address:  [address] [place], [place] [address]  Phone number:  [phone]   Household Members:  Self, Significant Other, Other (Comment) (Baby girl Sarah Ronnald Haley )   Natural Supports (not living in the home):  Immediate Family, Friends, Spouse/significant other   Professional Supports:     Employment: Part-time   Type of Work: Building surveyor    Education:  9 to 11 years   Pensions consultant:      Other Resources:      Cultural/Religious Considerations Which May Impact Care:  Per face sheet none   Strengths:      Risk Factors/Current Problems:  Other (Comment) (MOB hx of anxiety / depression )   Cognitive State:  Goal Oriented , Alert , Insightful    Mood/Affect:  Calm , Relaxed , Interested , Bright    CSW Assessment: CSW met with MOB at bedside. This Probation officer explained her role and reasoning for visit being due to MOB hx of anxiety/ depression. MOB describes not currently experiencing any anxiety/depression is not sure if she wants to get back on meds; however, if she feels she needs to she will consult her physician first. This Probation officer discussed PPD and SIDS. MOB verbalized understanding of the two. MOB states she is going to make an appointment at [facility] and with her foodstamps case worker to add baby. MOB states she is prepared for the home and has a car seat and crib/bassinett. No other needs addressed or requested at this time. Case closed to this CSW.    CSW Plan/Description:  No Further  Intervention Required/No Barriers to Discharge    Oda Cogan, MSW, [facility]  Office: [phone]

## 2016-04-03 NOTE — Lactation Note (Signed)
This note was copied from a baby's chart. Lactation Consultation Note  Patient Name: Girl Jeanell SparrowCaleigh Thomas ZOXWR'UToday's Date: 04/03/2016 Reason for consult: Follow-up assessment   With this mom of a term baby, now 6719 hours old. Mom has not been able to latch baby - she refuses, and falls asleep. Mom has been pumping and spoon feeding the baby.  The baby's upper l8ip frenulum seems to make a tight lip for flanging, and her tongue seems to sit far behind her gum line. I fitted mom with a 20 nipple shield, and after about a minutes, with stimulation, the baby began sucking. Mom weill try this, continue pumping every 3 hours in initiation setting, and feed EBM by spoon to baby. Mom knows to call for questions/concerns.    Maternal Data    Feeding Feeding Type: Breast Fed  LATCH Score/Interventions Latch: Repeated attempts needed to sustain latch, nipple held in mouth throughout feeding, stimulation needed to elicit sucking reflex. Intervention(s): Adjust position;Assist with latch  Audible Swallowing: None Intervention(s): Skin to skin;Hand expression  Type of Nipple: Everted at rest and after stimulation  Comfort (Breast/Nipple): Filling, red/small blisters or bruises, mild/mod discomfort  Problem noted: Mild/Moderate discomfort (mom feels uncomfortable biting with latach 20 nipple shiled fited to mom , with improvement in both latch and mom's comfort.) Interventions (Mild/moderate discomfort): Post-pump  Hold (Positioning): Assistance needed to correctly position infant at breast and maintain latch.  LATCH Score: 5  Lactation Tools Discussed/Used     Consult Status Consult Status: Follow-up Date: 04/03/16 Follow-up type: In-patient    Alfred LevinsLee, Anye Brose Anne 04/03/2016, 10:08 AM

## 2016-04-03 NOTE — Progress Notes (Signed)
S: No complaints. Feeling well. Lochia appropriate. No subjective fevers/chills.   O:  Gen: NAD, A&O Pulm: NWOB Abd: soft, appropriately ttp, fundus firm and below Umb Ext: No evidence of DVT, trace edema b/l  Labs CBC    Component Value Date/Time   WBC 16.8 (H) 04/03/2016 0603   RBC 3.71 (L) 04/03/2016 0603   HGB 10.3 (L) 04/03/2016 0603   HCT 30.7 (L) 04/03/2016 0603   PLT 265 04/03/2016 0603   MCV 82.7 04/03/2016 0603   MCH 27.8 04/03/2016 0603   MCHC 33.6 04/03/2016 0603   RDW 13.7 04/03/2016 0603   LYMPHSABS 2.7 09/25/2012 2127   MONOABS 1.0 09/25/2012 2127   EOSABS 0.6 09/25/2012 2127   BASOSABS 0.1 09/25/2012 2127    A/P:  PPD1 s/p SVD, doing well pp. AFVSS. Benign exam.  H/o depression/anxiety with self harm, currently stable. Latex allergy - will avoid. No other sig med/surg issues.  Continue present care.  It's a girl - "adeline"!   Plan for d/c PPD#2.   Belva AgeeElise Leger MD

## 2016-04-03 NOTE — Plan of Care (Signed)
Problem: Activity: Goal: Ability to tolerate increased activity will improve Outcome: Completed/Met Date Met: 04/03/16 Pt was able to walk to the bathroom on her own without any dizziness or lightheadedness.

## 2016-04-03 NOTE — Anesthesia Postprocedure Evaluation (Signed)
Anesthesia Post Note  Patient: Sarah Haley  Procedure(s) Performed: * No procedures listed *  Patient location during evaluation: Mother Baby Anesthesia Type: Epidural Level of consciousness: awake and alert Pain management: pain level controlled Vital Signs Assessment: post-procedure vital signs reviewed and stable Respiratory status: spontaneous breathing, nonlabored ventilation and respiratory function stable Cardiovascular status: stable Postop Assessment: no headache, no backache, epidural receding and patient able to bend at knees Anesthetic complications: no     Last Vitals:  Vitals:   04/02/16 2315 04/03/16 0611  BP: 122/66 118/71  Pulse: 64 62  Resp: 20 18  Temp: 36.6 C 36.6 C    Last Pain:  Vitals:   04/03/16 0611  TempSrc: Oral  PainSc:    Pain Goal: Patients Stated Pain Goal: 6 (04/01/16 2156)               Rica RecordsICKELTON,Akyla Vavrek

## 2016-04-04 ENCOUNTER — Encounter (HOSPITAL_COMMUNITY): Payer: Self-pay

## 2016-04-04 ENCOUNTER — Ambulatory Visit: Payer: Self-pay

## 2016-04-04 NOTE — Discharge Summary (Signed)
Obstetric Discharge Summary Reason for Admission: onset of labor Prenatal Procedures: none Intrapartum Procedures: spontaneous vaginal delivery Postpartum Procedures: none Complications-Operative and Postpartum: none Hemoglobin  Date Value Ref Range Status  04/03/2016 10.3 (L) 12.0 - 15.0 g/dL Final   HCT  Date Value Ref Range Status  04/03/2016 30.7 (L) 36.0 - 46.0 % Final    Physical Exam:  General: alert Lochia: appropriate Uterine Fundus: firm Incision: healing well DVT Evaluation: No evidence of DVT seen on physical exam.  Discharge Diagnoses: Term Pregnancy-delivered  Discharge Information: Date: 04/04/2016 Activity: pelvic rest Diet: routine Medications: Ibuprofen Condition: stable Instructions: refer to practice specific booklet Discharge to: home Follow-up Information    [facility] Home [facility]. Schedule an appointment as soon as possible for a visit in 6 week(s).   Specialty:  Family Medicine Contact information: [phone] [address] [place] [phone]           Newborn Data: Live born female  Birth Weight: 7 lb 8.8 oz (3425 g) APGAR: 8, 9  Home with mother.  Sarah Haley,Sarah Haley 04/04/2016, 8:31 AM

## 2016-04-04 NOTE — Progress Notes (Signed)
Patient tearful and upset.  States she is feeling overwhelmed.  Encouraged mom that that is a normal feeling.  Encouraged dad to help out more with baby.

## 2016-04-04 NOTE — Lactation Note (Signed)
This note was copied from a baby's chart. Lactation Consultation Note  Patient Name: Sarah Haley GNFAO'ZToday's Date: 04/04/2016 Reason for consult: Follow-up assessment  Baby 44 hours old. Mom reports that the baby has a tight frenulum, her nipples are sore, and she has decided to feed either EBM/formula by bottle. Enc mom to use EBM on nipples for healing and comfort. Mom states that she has been using the DEBP, but she isn't seeing anything at this time. Discussed progression of milk coming to volume, benefits of EBM, and enc post-pumping after each time the baby fed in order to enc a good EBM supply. Mom reports that she has a DEBP at home. Mom stated that baby will be staying another night d/t elevated bilirubin. Discussed the benefits of EBM for binding bilirubin, and enc mom to pump and use. Mom aware of OP/BFSG and LC phone line assistance after D/C.  Maternal Data    Feeding Feeding Type: Formula Nipple Type: Slow - flow  LATCH Score/Interventions                      Lactation Tools Discussed/Used     Consult Status Consult Status: PRN    Sherlyn HayJennifer D Osborn Pullin 04/04/2016, 11:11 AM

## 2016-04-05 ENCOUNTER — Ambulatory Visit: Payer: Self-pay

## 2016-04-05 NOTE — Lactation Note (Signed)
This note was copied from a baby's chart. Lactation Consultation Note  Patient Name: Girl Jeanell SparrowCaleigh Thomas YQMVH'QToday's Date: 04/05/2016 Reason for consult: Follow-up assessment Baby 66 hours old. Mom reports that she has not been putting the baby to breast, but has been pumping and giving EBM and formula. Mom has DEBP at home, and intends to continue to pump to increase her milk supply. Enc mom to call out at next latch for assistance as needed. Referred mom to Baby and Me booklet for number of diapers to expect by day of life and EBM storage guidelines. Mom aware of OP/BFSG and LC phone line assistance after D/C.   Maternal Data    Feeding Feeding Type: Breast Milk with Formula added Nipple Type: Slow - flow  LATCH Score/Interventions                      Lactation Tools Discussed/Used Tools: Pump Breast pump type: Double-Electric Breast Pump   Consult Status Consult Status: PRN    Sherlyn HayJennifer D Almus Woodham 04/05/2016, 8:40 AM

## 2017-07-07 IMAGING — US US OB COMP LESS 14 WK
1 series · 14 of 28 positions shown · non-contrast
Comparison: None.

CLINICAL DATA: Cramping and bleeding

EXAM:
OBSTETRIC <14 WK US AND TRANSVAGINAL OB US
TECHNIQUE: Both transabdominal and transvaginal ultrasound examinations were
performed for complete evaluation of the gestation as well as the
maternal uterus, adnexal regions, and pelvic cul-de-sac.
Transvaginal technique was performed to assess early pregnancy.

[Series 1: us ob comp less 14 wk · 14 of 50 slices shown]
[im 2/50]
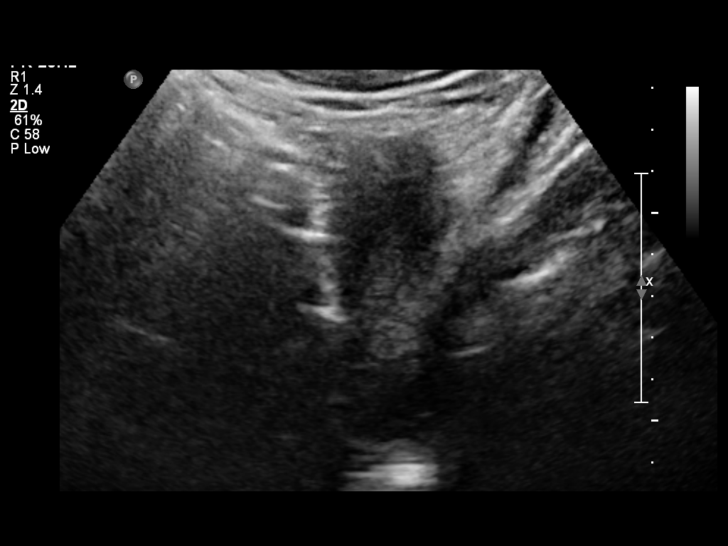
[im 6/50]
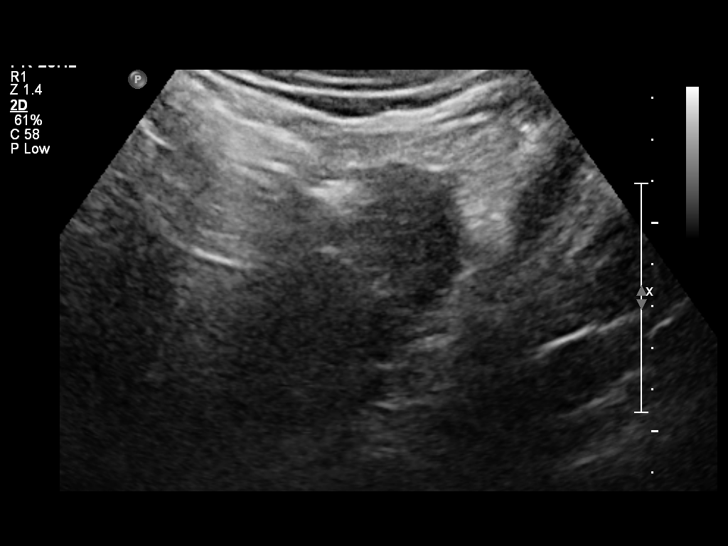
[im 10/50]
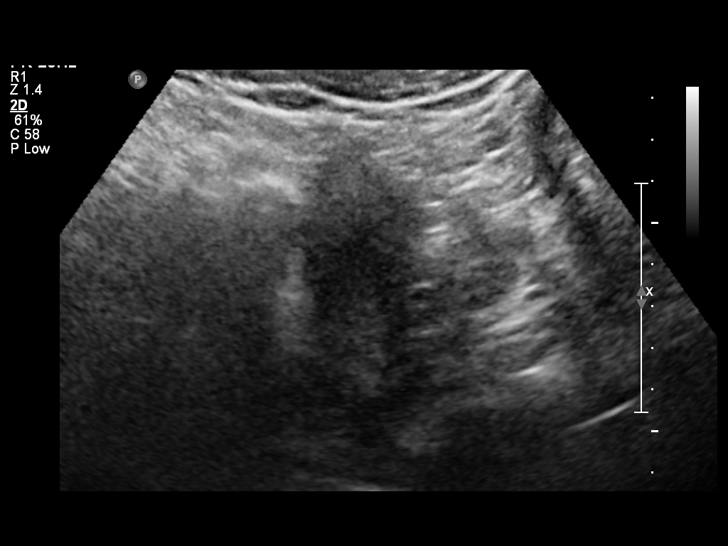
[im 13/50]
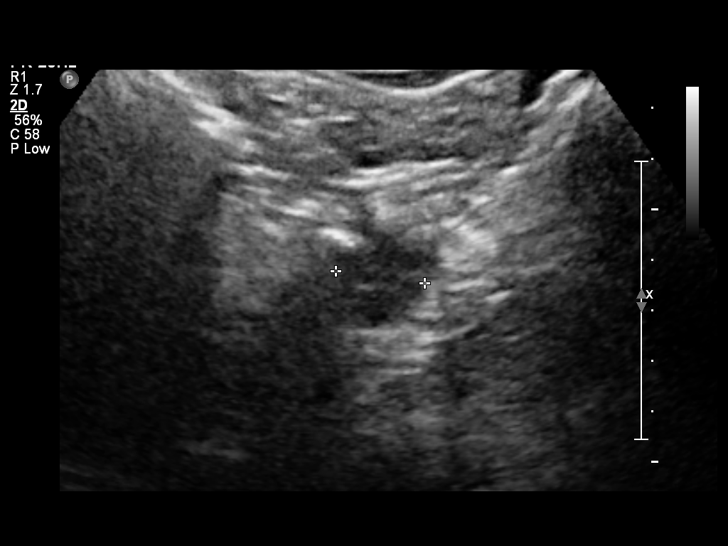
[im 17/50]
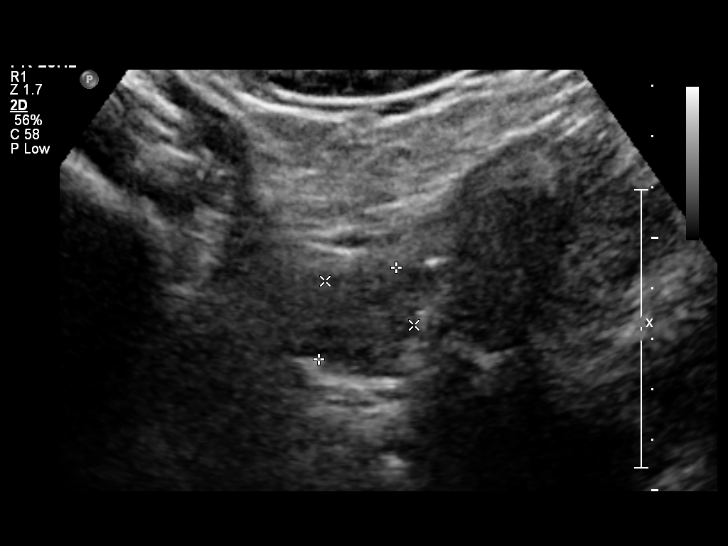
[im 20/50]
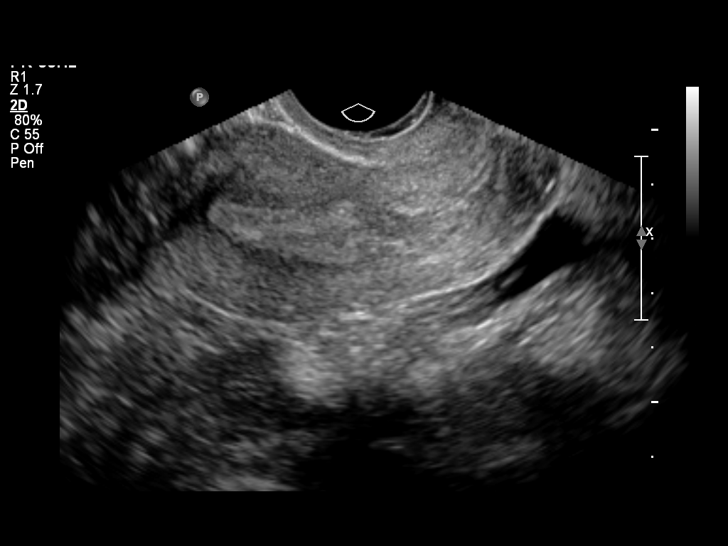
[im 24/50]
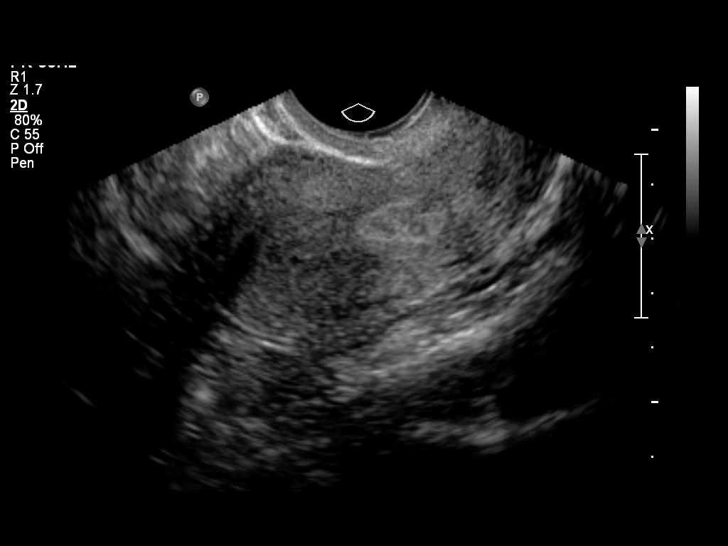
[im 28/50]
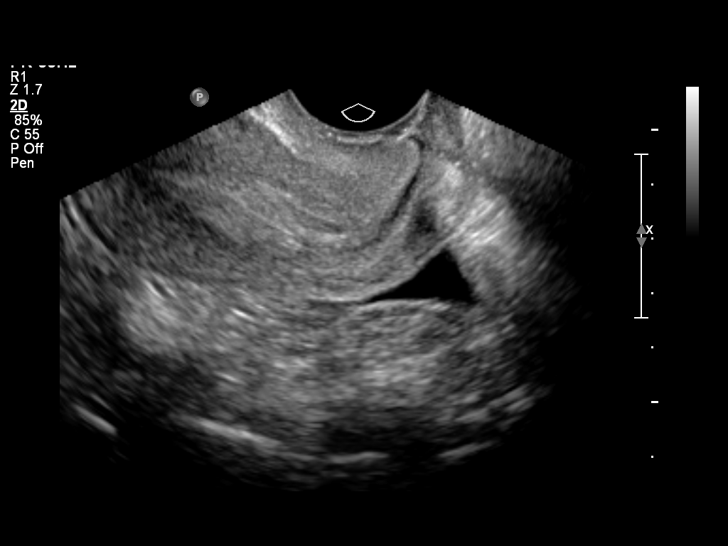
[im 31/50]
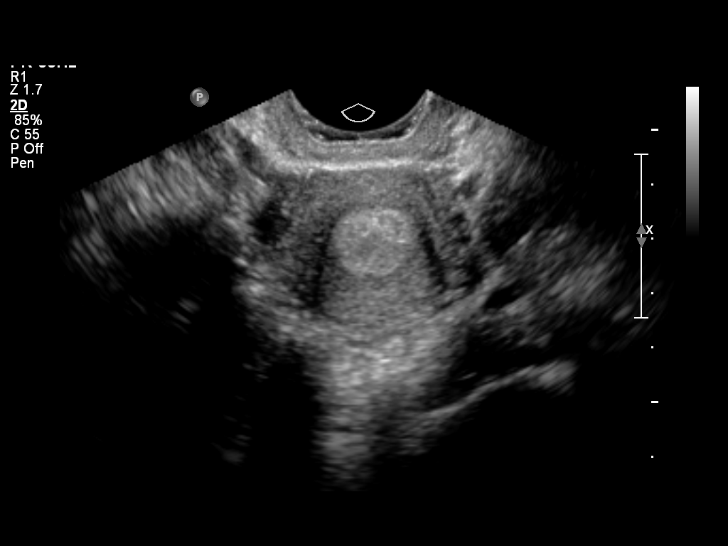
[im 35/50]
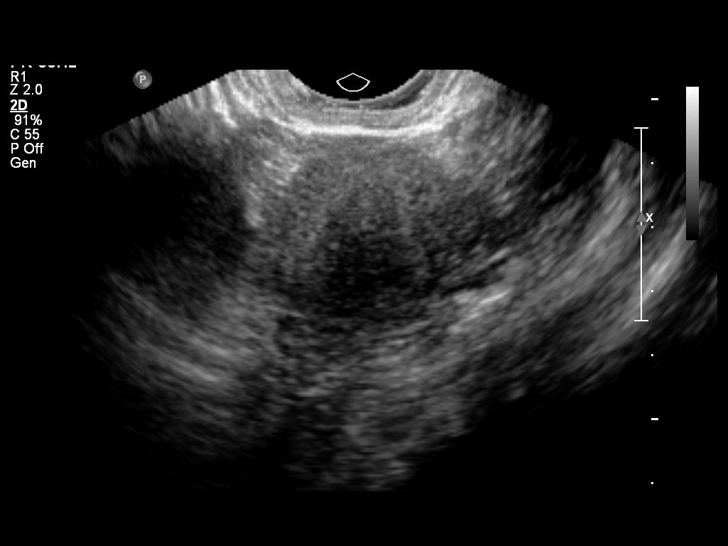
[im 39/50]
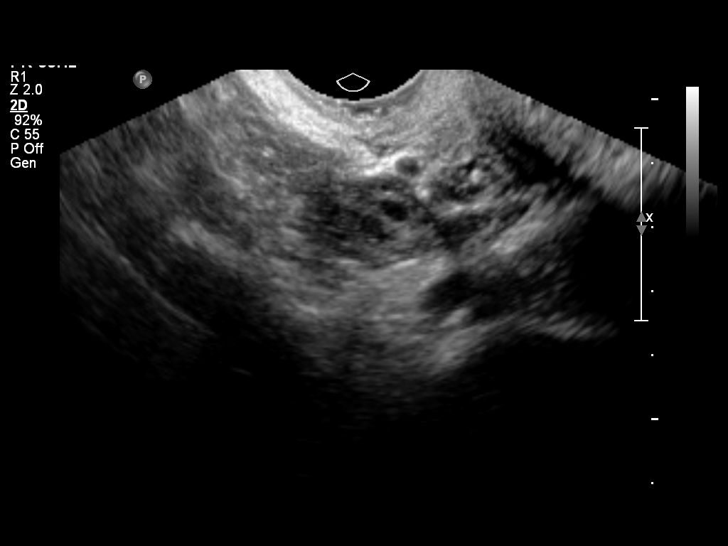
[im 42/50]
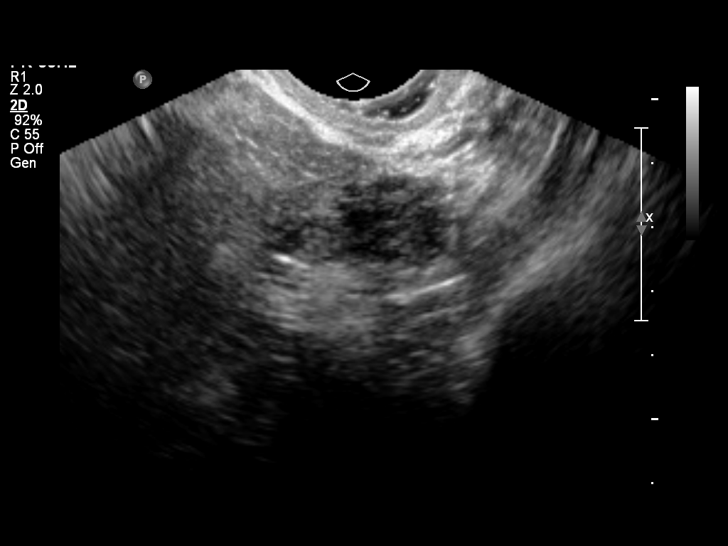
[im 46/50]
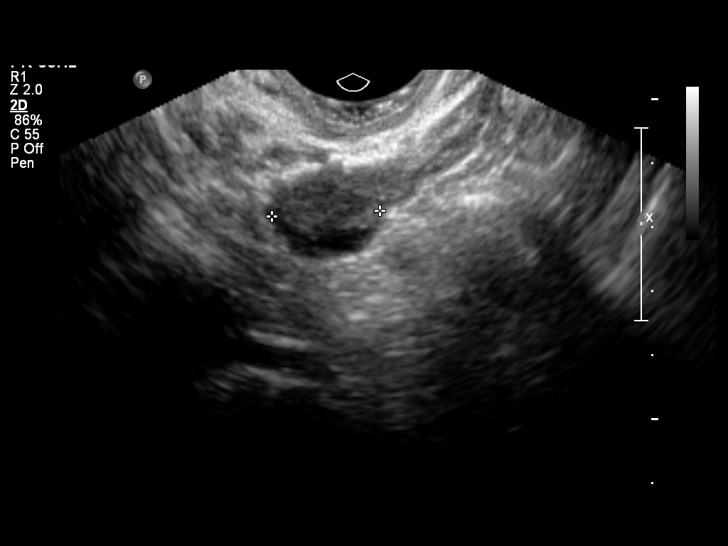
[im 50/50]
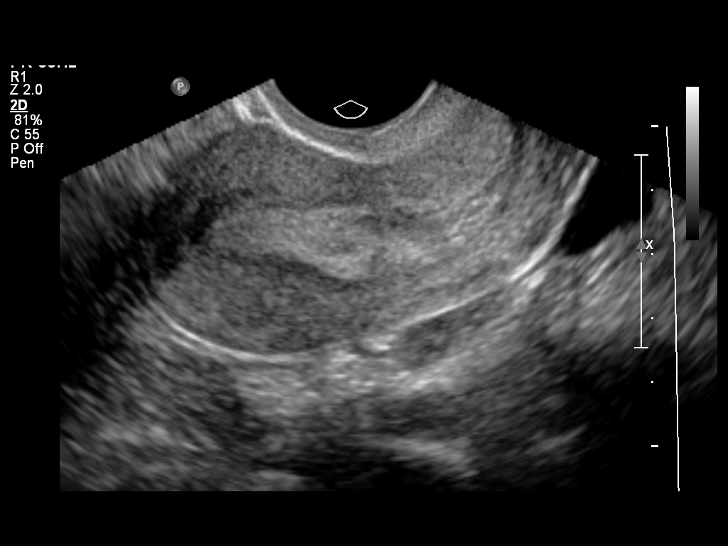

[14 of 28 positions shown; findings below may reference images not displayed]

FINDINGS: Intrauterine gestational sac: None

Yolk sac:  No

Embryo:  No

Cardiac Activity: No

Maternal uterus/adnexae: Normal. Trace free pelvic fluid in the
cul-de-sac.
IMPRESSION: No visible gestational sac. Differential considerations include a
missed spontaneous abortion, intrauterine gestation which is too
small to visualize, ectopic gestation. Serial HCG and follow-up
sonography recommended.

## 2018-07-19 ENCOUNTER — Encounter (HOSPITAL_COMMUNITY): Payer: Self-pay | Admitting: *Deleted

## 2018-07-19 ENCOUNTER — Inpatient Hospital Stay (HOSPITAL_COMMUNITY)
Admission: AD | Admit: 2018-07-19 | Discharge: 2018-07-19 | Disposition: A | Discharge status: Home / Self Care | Payer: BLUE CROSS/BLUE SHIELD | Attending: Obstetrics and Gynecology | Admitting: Obstetrics and Gynecology

## 2018-07-19 ENCOUNTER — Inpatient Hospital Stay (HOSPITAL_COMMUNITY): Payer: BLUE CROSS/BLUE SHIELD

## 2018-07-19 DIAGNOSIS — Z87891 Personal history of nicotine dependence: Secondary | ICD-10-CM | POA: Insufficient documentation

## 2018-07-19 DIAGNOSIS — O208 Other hemorrhage in early pregnancy: Secondary | ICD-10-CM

## 2018-07-19 DIAGNOSIS — Z3A01 Less than 8 weeks gestation of pregnancy: Secondary | ICD-10-CM | POA: Diagnosis not present

## 2018-07-19 DIAGNOSIS — O3680X0 Pregnancy with inconclusive fetal viability, not applicable or unspecified: Secondary | ICD-10-CM | POA: Diagnosis not present

## 2018-07-19 DIAGNOSIS — O209 Hemorrhage in early pregnancy, unspecified: Secondary | ICD-10-CM | POA: Insufficient documentation

## 2018-07-19 LAB — CBC
HEMATOCRIT: 39.7 % (ref 36.0–46.0)
HEMOGLOBIN: 13.2 g/dL (ref 12.0–15.0)
MCH: 29.7 pg (ref 26.0–34.0)
MCHC: 33.2 g/dL (ref 30.0–36.0)
MCV: 89.4 fL (ref 80.0–100.0)
Platelets: 295 10*3/uL (ref 150–400)
RBC: 4.44 MIL/uL (ref 3.87–5.11)
RDW: 12.7 % (ref 11.5–15.5)
WBC: 9.1 10*3/uL (ref 4.0–10.5)
nRBC: 0 % (ref 0.0–0.2)

## 2018-07-19 LAB — URINALYSIS, ROUTINE W REFLEX MICROSCOPIC
BILIRUBIN URINE: NEGATIVE
Bacteria, UA: NONE SEEN
GLUCOSE, UA: NEGATIVE mg/dL
Ketones, ur: 5 mg/dL — AB
LEUKOCYTES UA: NEGATIVE
NITRITE: NEGATIVE
PH: 6 (ref 5.0–8.0)
Protein, ur: NEGATIVE mg/dL
Specific Gravity, Urine: 1.032 — ABNORMAL HIGH (ref 1.005–1.030)

## 2018-07-19 LAB — POCT PREGNANCY, URINE: Preg Test, Ur: POSITIVE — AB

## 2018-07-19 LAB — HCG, QUANTITATIVE, PREGNANCY: hCG, Beta Chain, Quant, S: 277 m[IU]/mL — ABNORMAL HIGH (ref <5)

## 2018-07-19 LAB — WET PREP, GENITAL
SPERM: NONE SEEN
Trich, Wet Prep: NONE SEEN
Yeast Wet Prep HPF POC: NONE SEEN

## 2018-07-19 NOTE — Discharge Instructions (Signed)
Vaginal Bleeding During Pregnancy, First Trimester    A small amount of bleeding (spotting) from the vagina is common during early pregnancy. Sometimes the bleeding is normal and does not cause problems. At other times, though, bleeding may be a sign of something serious. Tell your doctor about any bleeding from your vagina right away.  Follow these instructions at home:  Activity  · Follow your doctor's instructions about how active you can be.  · If needed, make plans for someone to help with your normal activities.  · Do not have sex or orgasms until your doctor says that this is safe.  General instructions  · Take over-the-counter and prescription medicines only as told by your doctor.  · Watch your condition for any changes.  · Write down:  ? The number of pads you use each day.  ? How often you change pads.  ? How soaked (saturated) your pads are.  · Do not use tampons.  · Do not douche.  · If you pass any tissue from your vagina, save it to show to your doctor.  · Keep all follow-up visits as told by your doctor. This is important.  Contact a doctor if:  · You have vaginal bleeding at any time while you are pregnant.  · You have cramps.  · You have a fever.  Get help right away if:  · You have very bad cramps in your back or belly (abdomen).  · You pass large clots or a lot of tissue from your vagina.  · Your bleeding gets worse.  · You feel light-headed.  · You feel weak.  · You pass out (faint).  · You have chills.  · You are leaking fluid from your vagina.  · You have a gush of fluid from your vagina.  Summary  · Sometimes vaginal bleeding during pregnancy is normal and does not cause problems. At other times, bleeding may be a sign of something serious.  · Tell your doctor about any bleeding from your vagina right away.  · Follow your doctor's instructions about how active you can be. You may need someone to help you with your normal activities.  This information is not intended to replace advice given to  you by your health care provider. Make sure you discuss any questions you have with your health care provider.  Document Released: 12/02/2013 Document Revised: 10/19/2016 Document Reviewed: 10/19/2016  Elsevier Interactive Patient Education © 2019 [employer]

## 2018-07-19 NOTE — MAU Note (Signed)
Pt C/O spotting that started on Saturday, has become heavier & bright red today.  Had mild cramping earlier today, none right now.  Had pos HPT's, has done several.

## 2018-07-19 NOTE — MAU Provider Note (Signed)
Chief Complaint: Vaginal Bleeding   First Provider Initiated Contact with Patient 07/19/18 1959        SUBJECTIVE HPI: Sarah Haley is a 27 y.o. G3P0010 at 3231w3d by LMP who presents to maternity admissions reporting bleeding and cramping today.   Had a positive pregnancy test in early December.  . She denies vaginal itching/burning, urinary symptoms, h/a, dizziness, n/v, or fever/chills.    Vaginal Bleeding  The patient's primary symptoms include pelvic pain and vaginal bleeding. The patient's pertinent negatives include no genital itching, genital lesions or genital odor. The current episode started today. The problem has been unchanged. The pain is mild. She is pregnant. Pertinent negatives include no chills, constipation, diarrhea, dysuria, fever, nausea or vomiting. The vaginal discharge was bloody. The vaginal bleeding is lighter than menses. She has been passing clots. She has not been passing tissue. Nothing aggravates the symptoms. She has tried nothing for the symptoms.   RN Note: Pt C/O spotting that started on Saturday, has become heavier & bright red today.  Had mild cramping earlier today, none right now.  Had pos HPT's, has done several  Past Medical History:  Diagnosis Date  . Anxiety   . Depression    h/o self harm  . Medical history non-contributory   . Migraines    Past Surgical History:  Procedure Laterality Date  . WISDOM TOOTH EXTRACTION     Social History   Socioeconomic History  . Marital status: Single    Spouse name: Not on file  . Number of children: Not on file  . Years of education: Not on file  . Highest education level: Not on file  Occupational History  . Not on file  Social Needs  . Financial resource strain: Not on file  . Food insecurity:    Worry: Not on file    Inability: Not on file  . Transportation needs:    Medical: Not on file    Non-medical: Not on file  Tobacco Use  . Smoking status: Former Smoker    Last attempt to quit:  02/13/2015    Years since quitting: 3.4  . Smokeless tobacco: Former Engineer, [employer]  Substance and Sexual Activity  . Alcohol use: No  . Drug use: No  . Sexual activity: Yes  Lifestyle  . Physical activity:    Days per week: Not on file    Minutes per session: Not on file  . Stress: Not on file  Relationships  . Social connections:    Talks on phone: Not on file    Gets together: Not on file    Attends religious service: Not on file    Active member of club or organization: Not on file    Attends meetings of clubs or organizations: Not on file    Relationship status: Not on file  . Intimate partner violence:    Fear of current or ex partner: Not on file    Emotionally abused: Not on file    Physically abused: Not on file    Forced sexual activity: Not on file  Other Topics Concern  . Not on file  Social History Narrative  . Not on file   No current facility-administered medications on file prior to encounter.    Current Outpatient Medications on File Prior to Encounter  Medication Sig Dispense Refill  . prenatal vitamin w/FE, FA (NATACHEW) 29-1 MG CHEW chewable tablet Chew 1 tablet by mouth daily at 12 noon.     Allergies  Allergen Reactions  .  Kiwi Extract Itching  . Latex Swelling    I have reviewed patient's Past Medical Hx, Surgical Hx, Family Hx, Social Hx, medications and allergies.   ROS:  Review of Systems  Constitutional: Negative for chills and fever.  Gastrointestinal: Negative for constipation, diarrhea, nausea and vomiting.  Genitourinary: Positive for pelvic pain and vaginal bleeding. Negative for dysuria.   Review of Systems  Other systems negative   Physical Exam  Physical Exam Patient Vitals for the past 24 hrs:  BP Temp Temp src Pulse Resp Height Weight  07/19/18 1751 123/63 98 F (36.7 C) Oral 64 16 5\' 5"  (1.651 m) 68 kg   Constitutional: Well-developed, well-nourished female in no acute distress.  Cardiovascular: normal rate Respiratory: normal  effort GI: Abd soft, non-tender. Pos BS x 4 MS: Extremities nontender, no edema, normal ROM Neurologic: Alert and oriented x 4.  GU: Neg CVAT.  PELVIC EXAM: Cervix pink, visually closed, without lesion, scant red discharge, vaginal walls and external genitalia normal Bimanual exam: Cervix 0/long/high, firm, anterior, neg CMT, uterus nontender, nonenlarged, adnexa without tenderness, enlargement, or mass   LAB RESULTS Results for orders placed or performed during the hospital encounter of 07/19/18 (from the past 24 hour(s))  Urinalysis, Routine w reflex microscopic     Status: Abnormal   Collection Time: 07/19/18  6:13 PM  Result Value Ref Range   Color, Urine YELLOW YELLOW   APPearance CLEAR CLEAR   Specific Gravity, Urine 1.032 (H) 1.005 - 1.030   pH 6.0 5.0 - 8.0   Glucose, UA NEGATIVE NEGATIVE mg/dL   Hgb urine dipstick SMALL (A) NEGATIVE   Bilirubin Urine NEGATIVE NEGATIVE   Ketones, ur 5 (A) NEGATIVE mg/dL   Protein, ur NEGATIVE NEGATIVE mg/dL   Nitrite NEGATIVE NEGATIVE   Leukocytes, UA NEGATIVE NEGATIVE   RBC / HPF 11-20 0 - 5 RBC/hpf   WBC, UA 0-5 0 - 5 WBC/hpf   Bacteria, UA NONE SEEN NONE SEEN   Squamous Epithelial / LPF 0-5 0 - 5   Mucus PRESENT   Pregnancy, urine POC     Status: Abnormal   Collection Time: 07/19/18  6:22 PM  Result Value Ref Range   Preg Test, Ur POSITIVE (A) NEGATIVE  CBC     Status: None   Collection Time: 07/19/18  6:49 PM  Result Value Ref Range   WBC 9.1 4.0 - 10.5 K/uL   RBC 4.44 3.87 - 5.11 MIL/uL   Hemoglobin 13.2 12.0 - 15.0 g/dL   HCT 40.939.7 81.136.0 - 91.446.0 %   MCV 89.4 80.0 - 100.0 fL   MCH 29.7 26.0 - 34.0 pg   MCHC 33.2 30.0 - 36.0 g/dL   RDW 78.212.7 95.611.5 - 21.315.5 %   Platelets 295 150 - 400 K/uL   nRBC 0.0 0.0 - 0.2 %  hCG, quantitative, pregnancy     Status: Abnormal   Collection Time: 07/19/18  6:49 PM  Result Value Ref Range   hCG, Beta Chain, Quant, S 277 (H) <5 mIU/mL     IMAGING [place] Ob Less Than 14 Weeks With Ob  Transvaginal  Result Date: 07/19/2018 CLINICAL DATA:  Pregnant patient with vaginal bleeding. EXAM: OBSTETRIC <14 WK US AND TRANSVAGINAL OB US TECHNIQUE: Both transabdominal and transvaginal ultrasound examinations were performed for complete evaluation of the gestation as well as the maternal uterus, adnexal regions, and pelvic cul-de-sac. Transvaginal technique was performed to assess early pregnancy. COMPARISON:  None. FINDINGS: Intrauterine gestational sac: None Yolk sac:  Not Visualized.  Embryo:  Not Visualized. Cardiac Activity: Not Visualized. Maternal uterus/adnexae: Normal right and left ovaries. No free fluid in the pelvis. IMPRESSION: No intrauterine gestation identified. In the setting of positive pregnancy test and no definite intrauterine pregnancy, this reflects a pregnancy of unknown location. Differential considerations include early normal IUP, abnormal IUP, or nonvisualized ectopic pregnancy. Differentiation is achieved with serial beta HCG supplemented by repeat sonography as clinically warranted. Electronically Signed   By: Annia Belt M.D.   On: 07/19/2018 19:52    MAU Management/MDM: Ordered usual first trimester r/o ectopic labs.   Pelvic exam and cultures done Will check baseline Ultrasound to rule out ectopic.   This bleeding/pain can represent a normal pregnancy with bleeding, spontaneous abortion or even an ectopic which can be life-threatening.  The process as listed above helps to determine which of these is present.  Reviewed findings with patient.  Discussed possible scenarios including early pregnancy, threatened SAB and ectopic.  This likely represents an abnormal pregnancy given positive test 2 weeks ago and level of 27  Today. Would have expected it to be over 1000.  But explained these levels are not always predictable and we really won't know until we do a repeat test in 48 hours.  ASSESSMENT 1. Vaginal bleeding affecting early pregnancy   2.     Pregnancy of  unknown location  PLAN Discharge home Plan to repeat HCG level in 48 hours in MAU  Will repeat  Ultrasound in about 7-10 days if HCG levels double appropriately  Has appt at [facility] on 08/08/18 Ectopic and bleeding precautions  Pt stable at time of discharge. Encouraged to return here or to other Urgent Care/ED if she develops worsening of symptoms, increase in pain, fever, or other concerning symptoms.    Wynelle Bourgeois CNM, MSN Certified Nurse-Midwife 07/19/2018  7:59 PM

## 2018-07-20 LAB — GC/CHLAMYDIA PROBE AMP (~~LOC~~) NOT AT ARMC
CHLAMYDIA, DNA PROBE: NEGATIVE
NEISSERIA GONORRHEA: NEGATIVE

## 2019-06-10 ENCOUNTER — Encounter (HOSPITAL_COMMUNITY): Payer: Self-pay

## 2020-11-26 IMAGING — US US OB < 14 WEEKS - US OB TV
1 series · 15 of 28 positions shown · non-contrast
Comparison: None.

CLINICAL DATA: Pregnant patient with vaginal bleeding.

EXAM:
OBSTETRIC <14 WK US AND TRANSVAGINAL OB US
TECHNIQUE: Both transabdominal and transvaginal ultrasound examinations were
performed for complete evaluation of the gestation as well as the
maternal uterus, adnexal regions, and pelvic cul-de-sac.
Transvaginal technique was performed to assess early pregnancy.

[Series 1: us ob < 14 weeks - us ob tv · 15 of 50 slices shown]
[im 1/50]
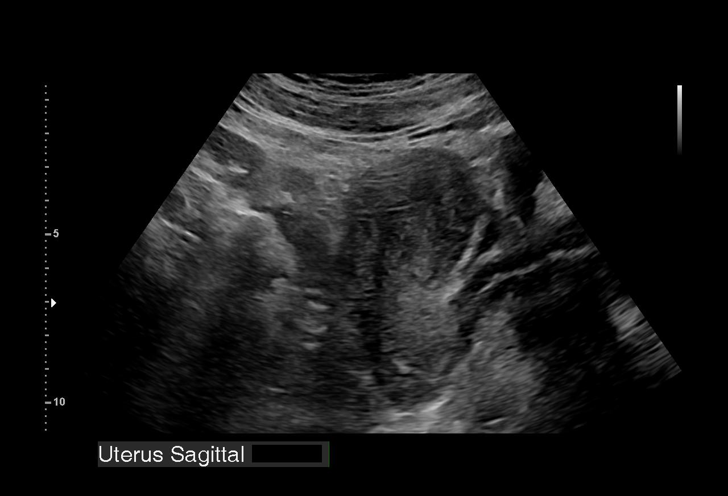
[im 4/50]
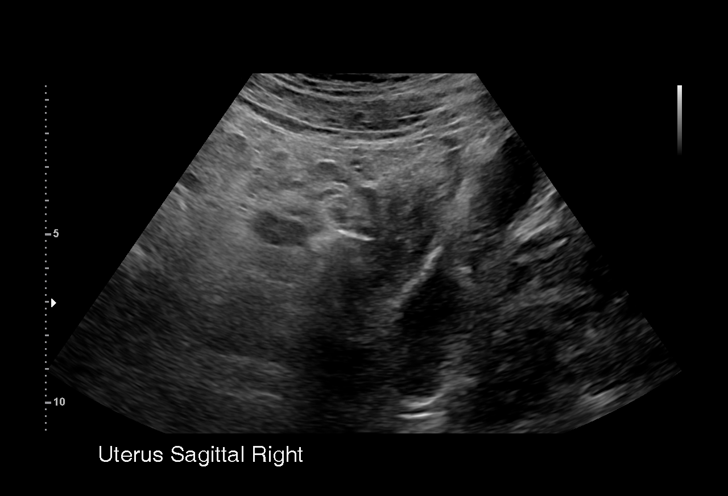
[im 8/50]
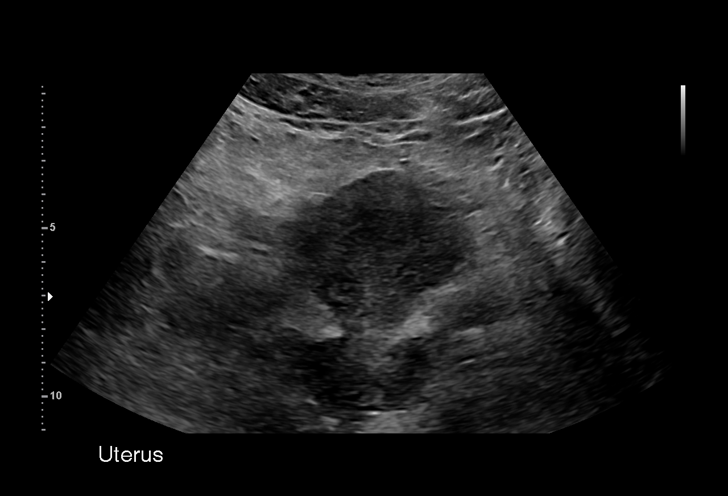
[im 11/50]
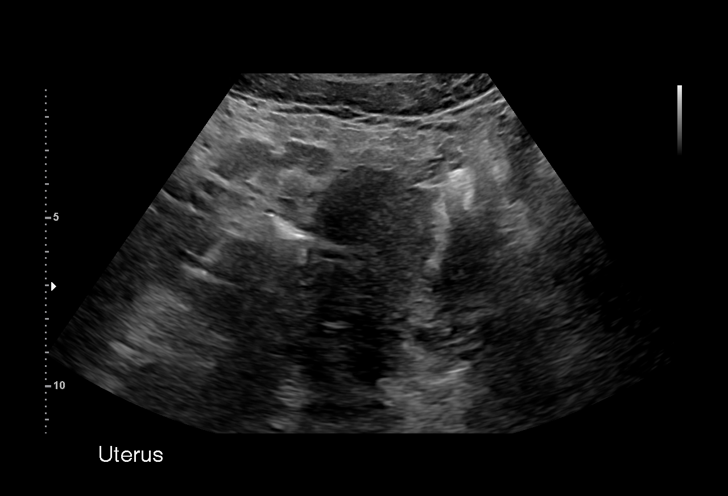
[im 15/50]
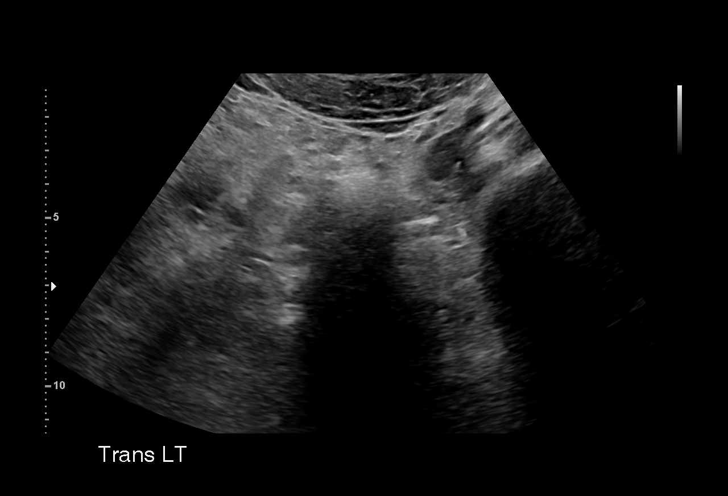
[im 19/50]
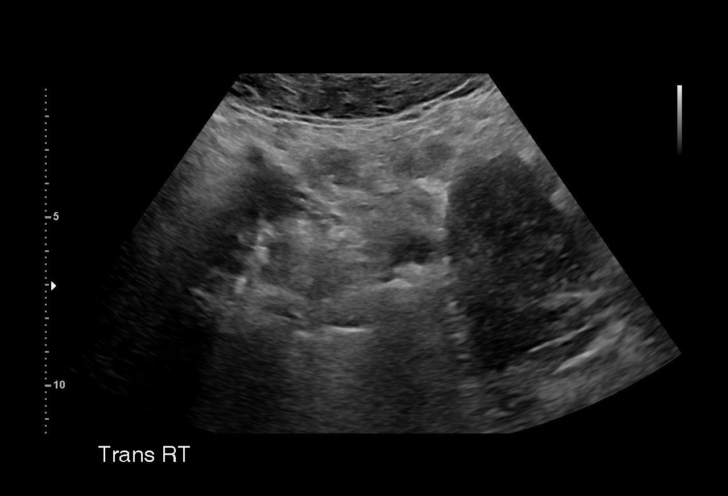
[im 22/50]
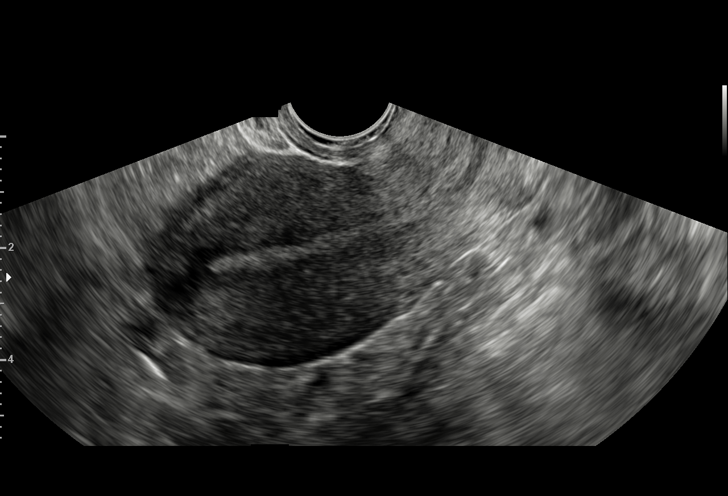
[im 26/50]
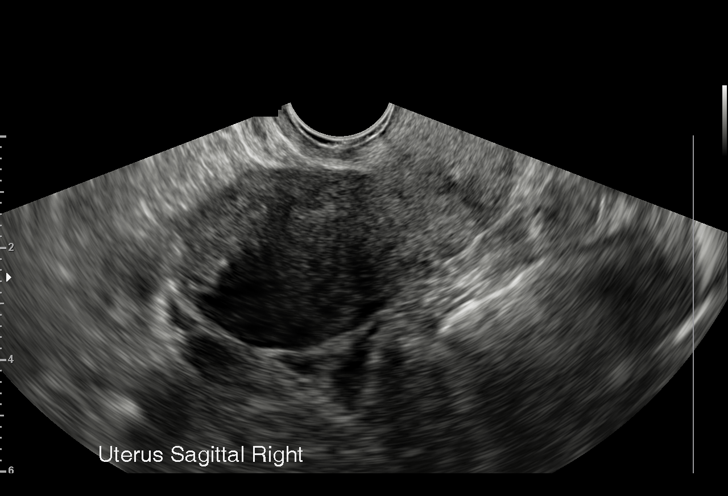
[im 28/50]
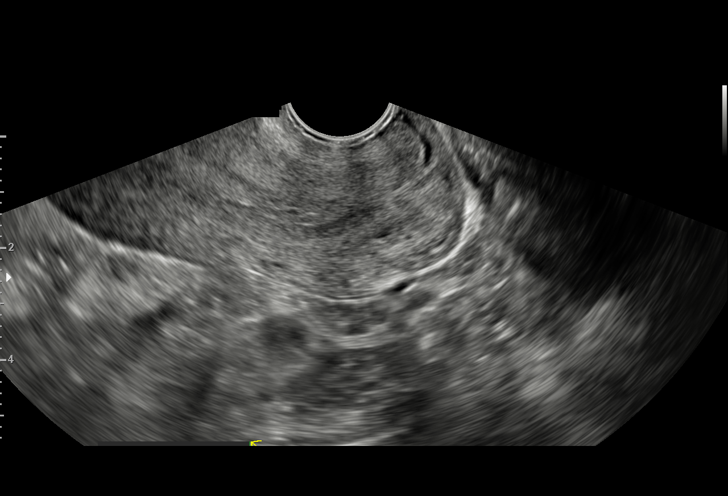
[im 31/50]
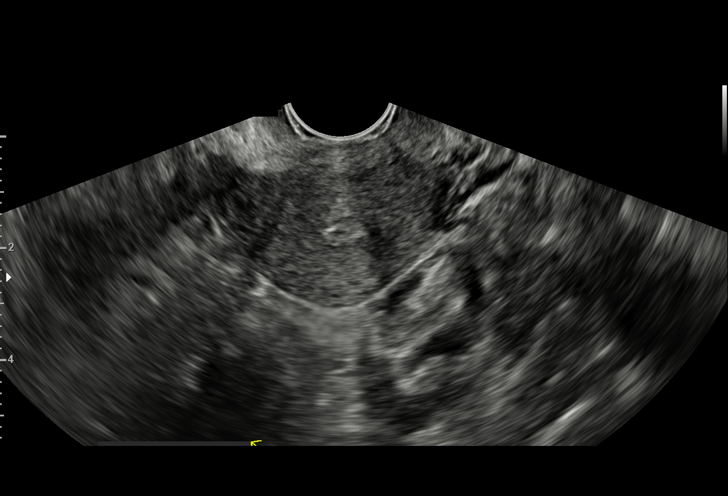
[im 35/50]
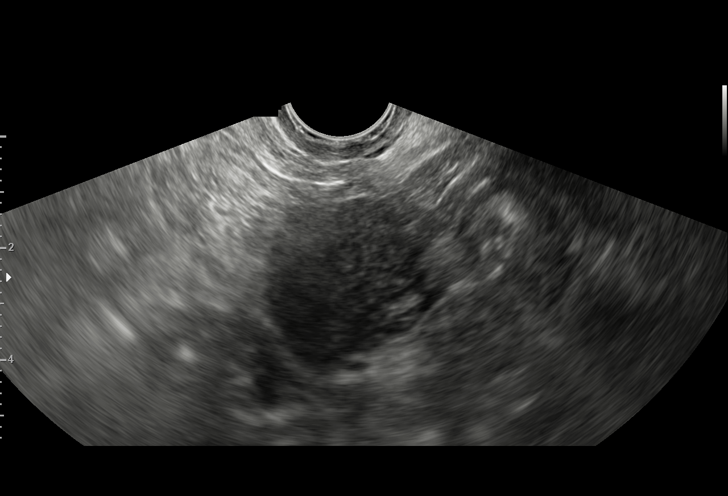
[im 39/50]
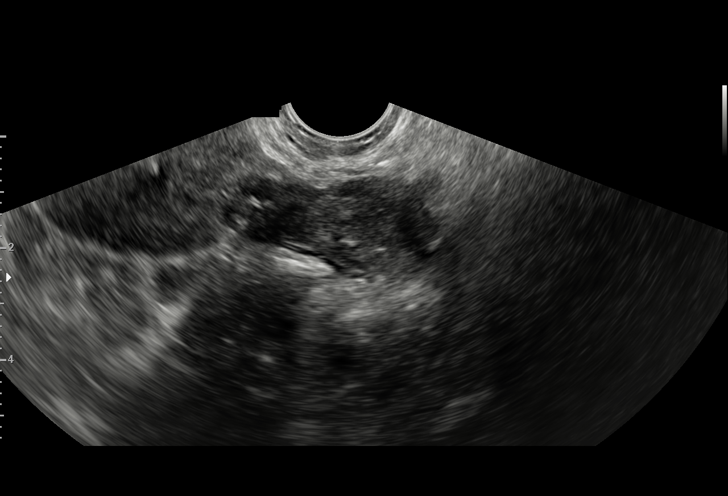
[im 42/50]
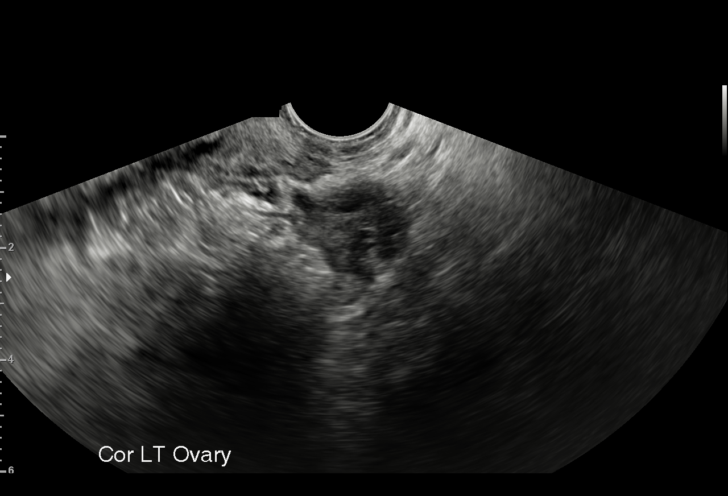
[im 46/50]
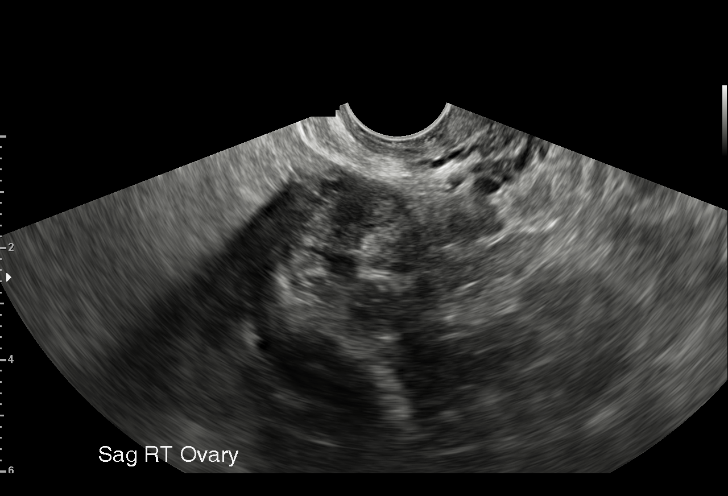
[im 50/50]
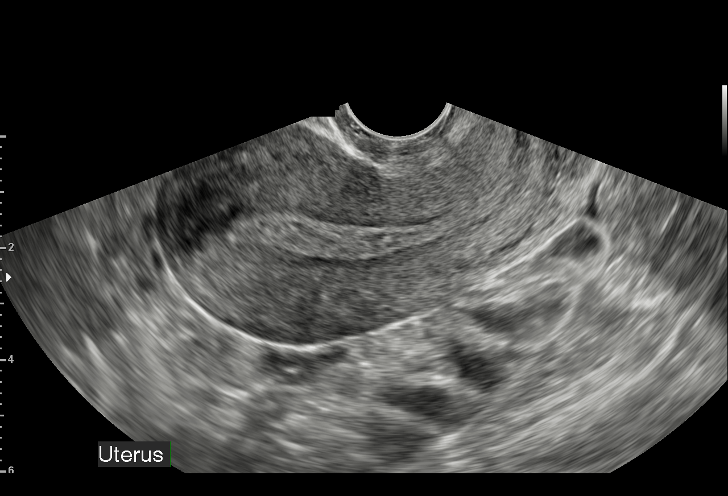

[15 of 28 positions shown; findings below may reference images not displayed]

FINDINGS: Intrauterine gestational sac: None

Yolk sac:  Not Visualized.

Embryo:  Not Visualized.

Cardiac Activity: Not Visualized.

Maternal uterus/adnexae: Normal right and left ovaries. No free
fluid in the pelvis.
IMPRESSION: No intrauterine gestation identified. In the setting of positive
pregnancy test and no definite intrauterine pregnancy, this reflects
a pregnancy of unknown location. Differential considerations include
early normal IUP, abnormal IUP, or nonvisualized ectopic pregnancy.
Differentiation is achieved with serial beta HCG supplemented by
repeat sonography as clinically warranted.
